# Patient Record
Sex: Female | Born: 1950 | Race: Black or African American | Hispanic: No | Marital: Married | Smoking: Never smoker
Health system: Southern US, Community
[De-identification: ages and names within clinical notes are randomized; demographics above are authoritative.]

## PROBLEM LIST (undated history)

## (undated) DIAGNOSIS — Z170 Estrogen receptor positive status [ER+]: Principal | ICD-10-CM

## (undated) DIAGNOSIS — Z8042 Family history of malignant neoplasm of prostate: Secondary | ICD-10-CM

## (undated) DIAGNOSIS — C50412 Malignant neoplasm of upper-outer quadrant of left female breast: Principal | ICD-10-CM

## (undated) DIAGNOSIS — Z923 Personal history of irradiation: Secondary | ICD-10-CM

## (undated) DIAGNOSIS — Z800 Family history of malignant neoplasm of digestive organs: Secondary | ICD-10-CM

## (undated) DIAGNOSIS — Z803 Family history of malignant neoplasm of breast: Secondary | ICD-10-CM

## (undated) DIAGNOSIS — Z8051 Family history of malignant neoplasm of kidney: Secondary | ICD-10-CM

## (undated) HISTORY — DX: Family history of malignant neoplasm of digestive organs: Z80.0

## (undated) HISTORY — DX: Family history of malignant neoplasm of prostate: Z80.42

## (undated) HISTORY — DX: Malignant neoplasm of upper-outer quadrant of left female breast: C50.412

## (undated) HISTORY — DX: Estrogen receptor positive status (ER+): Z17.0

## (undated) HISTORY — DX: Family history of malignant neoplasm of breast: Z80.3

## (undated) HISTORY — PX: BREAST EXCISIONAL BIOPSY: SUR124

## (undated) HISTORY — DX: Family history of malignant neoplasm of kidney: Z80.51

---

## 2000-03-24 ENCOUNTER — Ambulatory Visit (HOSPITAL_COMMUNITY): Admission: RE | Admit: 2000-03-24 | Discharge: 2000-03-24 | Payer: Self-pay | Admitting: Family Medicine

## 2000-03-24 ENCOUNTER — Encounter: Payer: Self-pay | Admitting: Family Medicine

## 2001-07-19 ENCOUNTER — Other Ambulatory Visit: Admission: RE | Admit: 2001-07-19 | Discharge: 2001-07-19 | Payer: Self-pay | Admitting: Family Medicine

## 2004-05-13 ENCOUNTER — Ambulatory Visit (HOSPITAL_COMMUNITY): Admission: RE | Admit: 2004-05-13 | Discharge: 2004-05-13 | Payer: Self-pay | Admitting: *Deleted

## 2005-07-16 ENCOUNTER — Other Ambulatory Visit: Admission: RE | Admit: 2005-07-16 | Discharge: 2005-07-16 | Payer: Self-pay | Admitting: Family Medicine

## 2006-09-22 ENCOUNTER — Other Ambulatory Visit: Admission: RE | Admit: 2006-09-22 | Discharge: 2006-09-22 | Payer: Self-pay | Admitting: Family Medicine

## 2009-11-30 ENCOUNTER — Emergency Department (HOSPITAL_COMMUNITY): Admission: EM | Admit: 2009-11-30 | Discharge: 2009-11-30 | Payer: Self-pay | Admitting: Emergency Medicine

## 2010-02-19 ENCOUNTER — Encounter: Admission: RE | Admit: 2010-02-19 | Discharge: 2010-02-19 | Payer: Self-pay | Admitting: Internal Medicine

## 2013-01-16 ENCOUNTER — Encounter (HOSPITAL_COMMUNITY): Payer: Self-pay | Admitting: Emergency Medicine

## 2013-01-16 ENCOUNTER — Emergency Department (HOSPITAL_COMMUNITY)
Admission: EM | Admit: 2013-01-16 | Discharge: 2013-01-16 | Disposition: A | Discharge status: Home / Self Care | Payer: Self-pay | Attending: Emergency Medicine | Admitting: Emergency Medicine

## 2013-01-16 DIAGNOSIS — S90562A Insect bite (nonvenomous), left ankle, initial encounter: Secondary | ICD-10-CM

## 2013-01-16 DIAGNOSIS — Z79899 Other long term (current) drug therapy: Secondary | ICD-10-CM | POA: Insufficient documentation

## 2013-01-16 DIAGNOSIS — Y939 Activity, unspecified: Secondary | ICD-10-CM | POA: Insufficient documentation

## 2013-01-16 DIAGNOSIS — IMO0001 Reserved for inherently not codable concepts without codable children: Secondary | ICD-10-CM | POA: Insufficient documentation

## 2013-01-16 DIAGNOSIS — Y9289 Other specified places as the place of occurrence of the external cause: Secondary | ICD-10-CM | POA: Insufficient documentation

## 2013-01-16 DIAGNOSIS — S90569A Insect bite (nonvenomous), unspecified ankle, initial encounter: Secondary | ICD-10-CM | POA: Insufficient documentation

## 2013-01-16 MED ORDER — PREDNISONE (PAK) 10 MG PO TABS: ORAL_TABLET | ORAL | Status: DC

## 2013-01-16 NOTE — ED Provider Notes (Signed)
This chart was scribed for non-physician practitioner Trisha Mangle, PA-C working with Derwood Kaplan, MD, by Candelaria Stagers, ED Scribe. This patient was seen in room TR05C/TR05C and the patient's care was started at 5:16 PM  CSN: [id number]     Arrival date & time 01/16/13  1403 History     First MD Initiated Contact with Patient 01/16/13 1701     Chief Complaint  Patient presents with  . Insect Bite    The history is provided by the patient. No language interpreter was used.   HPI Comments: Kerry Newman is a 62 y.o. female who presents to the Emergency Department complaining of left ankle redness and swelling that started yesterday after an insect stung her while she was mowing the yard.  Pt is unsure what stung her but states that it had wings.  She denies any allergies or h/o easily bruising or bleeding.  Pt ambulates.  She reports she has had a reaction to bee stings in the past, but has never experienced this much swelling.  Pt has taken benadryl with little relief.    History reviewed. No pertinent past medical history. History reviewed. No pertinent past surgical history. No family history on file. History  Substance Use Topics  . Smoking status: Not on file  . Smokeless tobacco: Not on file  . Alcohol Use: No   OB History   Grav Para Term Preterm Abortions TAB SAB Ect Mult Living                 Review of Systems  Skin: Positive for color change (redness and swelling to left ankle) and wound (insect sting to left ankle).  All other systems reviewed and are negative.    Allergies  Review of patient's allergies indicates no known allergies.  Home Medications   Current Outpatient Rx  Name  Route  Sig  Dispense  Refill  . diphenhydrAMINE (BENADRYL) 25 mg capsule   Oral   Take 25 mg by mouth every 6 (six) hours as needed for itching or allergies.         Marland Kitchen KRILL OIL PO   Oral   Take 1 capsule by mouth daily.         . Multiple Vitamin (MULTIVITAMIN WITH  MINERALS) TABS   Oral   Take 1 tablet by mouth daily.         Marland Kitchen VITAMIN E PO   Oral   Take 1 capsule by mouth daily.          BP 127/78  Pulse 78  Temp(Src) 98.2 F (36.8 C) (Oral)  Resp 18  Ht 5\' 8"  (1.727 m)  Wt 144 lb (65.318 kg)  BMI 21.9 kg/m2  SpO2 98% Physical Exam  Nursing note and vitals reviewed. Constitutional: She is oriented to person, place, and time. She appears well-developed and well-nourished. No distress.  HENT:  Head: Normocephalic and atraumatic.  Eyes: EOM are normal.  Neck: Neck supple. No tracheal deviation present.  Cardiovascular: Normal rate.   Pulmonary/Chest: Effort normal. No respiratory distress.  Musculoskeletal: Normal range of motion.  Redness, swelling, and hematoma to left lateral ankle.   Neurological: She is alert and oriented to person, place, and time.  Skin: Skin is warm and dry.  Psychiatric: She has a normal mood and affect. Her behavior is normal.    ED Course   Procedures   DIAGNOSTIC STUDIES: Oxygen Saturation is 98% on room air, normal by my interpretation.    COORDINATION  OF CARE:   5:27 PM Discussed course of care with pt which includes prescribed steroids.  Pt understands and agrees.     Labs Reviewed - No data to display No results found. 1. Insect bite of left ankle, initial encounter     MDM  No results found for this or any previous visit. No results found.    I personally performed the services in this documentation, which was scribed in my presence.  The recorded information has been reviewed and considered.   Barnet Pall.  Lonia Skinner [place], PA-C 01/16/13 1739

## 2013-01-16 NOTE — ED Notes (Signed)
Pt. Stated, i was mowing and something stung me.  Left lower leg and ankle swollen and red.

## 2013-01-17 NOTE — ED Provider Notes (Signed)
Medical screening examination/treatment/procedure(s) were performed by non-physician practitioner and as supervising physician I was immediately available for consultation/collaboration.  Graeme Menees, MD 01/17/13 0031 

## 2013-07-12 ENCOUNTER — Other Ambulatory Visit: Payer: Self-pay | Admitting: Internal Medicine

## 2013-07-12 DIAGNOSIS — K625 Hemorrhage of anus and rectum: Secondary | ICD-10-CM

## 2013-07-14 ENCOUNTER — Other Ambulatory Visit: Payer: Self-pay

## 2013-07-14 DIAGNOSIS — Z1231 Encounter for screening mammogram for malignant neoplasm of breast: Secondary | ICD-10-CM

## 2013-07-21 ENCOUNTER — Ambulatory Visit
Admission: RE | Admit: 2013-07-21 | Discharge: 2013-07-21 | Disposition: A | Discharge status: Home / Self Care | Payer: 59 | Source: Ambulatory Visit | Attending: Internal Medicine | Admitting: Internal Medicine

## 2013-07-21 DIAGNOSIS — K625 Hemorrhage of anus and rectum: Secondary | ICD-10-CM

## 2013-07-21 MED ORDER — IOHEXOL 300 MG/ML  SOLN
100.0000 mL | Freq: Once | INTRAMUSCULAR | Status: AC | PRN
  Administered 2013-07-21: 100 mL via INTRAVENOUS

## 2013-07-22 ENCOUNTER — Other Ambulatory Visit: Payer: Self-pay

## 2013-08-03 ENCOUNTER — Ambulatory Visit: Payer: Self-pay

## 2013-08-30 ENCOUNTER — Ambulatory Visit: Payer: 59

## 2013-09-08 ENCOUNTER — Ambulatory Visit
Admission: RE | Admit: 2013-09-08 | Discharge: 2013-09-08 | Disposition: A | Discharge status: Home / Self Care | Payer: 59 | Source: Ambulatory Visit

## 2013-09-08 DIAGNOSIS — Z1231 Encounter for screening mammogram for malignant neoplasm of breast: Secondary | ICD-10-CM

## 2015-01-22 ENCOUNTER — Other Ambulatory Visit: Payer: Self-pay

## 2015-01-22 DIAGNOSIS — Z1231 Encounter for screening mammogram for malignant neoplasm of breast: Secondary | ICD-10-CM

## 2015-01-31 ENCOUNTER — Ambulatory Visit: Payer: Self-pay

## 2015-03-07 ENCOUNTER — Ambulatory Visit
Admission: RE | Admit: 2015-03-07 | Discharge: 2015-03-07 | Disposition: A | Discharge status: Home / Self Care | Payer: 59 | Source: Ambulatory Visit

## 2015-03-07 DIAGNOSIS — Z1231 Encounter for screening mammogram for malignant neoplasm of breast: Secondary | ICD-10-CM

## 2015-04-28 ENCOUNTER — Other Ambulatory Visit: Payer: Self-pay | Admitting: Internal Medicine

## 2015-04-28 DIAGNOSIS — E2839 Other primary ovarian failure: Secondary | ICD-10-CM

## 2016-09-10 ENCOUNTER — Emergency Department (HOSPITAL_BASED_OUTPATIENT_CLINIC_OR_DEPARTMENT_OTHER)
Admission: EM | Admit: 2016-09-10 | Discharge: 2016-09-10 | Disposition: A | Discharge status: Home / Self Care | Payer: Medicare HMO | Attending: Emergency Medicine | Admitting: Emergency Medicine

## 2016-09-10 ENCOUNTER — Encounter (HOSPITAL_BASED_OUTPATIENT_CLINIC_OR_DEPARTMENT_OTHER): Payer: Self-pay | Admitting: Emergency Medicine

## 2016-09-10 ENCOUNTER — Emergency Department (HOSPITAL_BASED_OUTPATIENT_CLINIC_OR_DEPARTMENT_OTHER): Payer: Medicare HMO

## 2016-09-10 DIAGNOSIS — Z7982 Long term (current) use of aspirin: Secondary | ICD-10-CM | POA: Diagnosis not present

## 2016-09-10 DIAGNOSIS — K625 Hemorrhage of anus and rectum: Secondary | ICD-10-CM

## 2016-09-10 DIAGNOSIS — R1084 Generalized abdominal pain: Secondary | ICD-10-CM | POA: Diagnosis present

## 2016-09-10 DIAGNOSIS — R55 Syncope and collapse: Secondary | ICD-10-CM

## 2016-09-10 DIAGNOSIS — F17290 Nicotine dependence, other tobacco product, uncomplicated: Secondary | ICD-10-CM | POA: Diagnosis not present

## 2016-09-10 LAB — COMPREHENSIVE METABOLIC PANEL
ALT: 22 U/L (ref 14–54)
AST: 24 U/L (ref 15–41)
Albumin: 4.1 g/dL (ref 3.5–5.0)
Alkaline Phosphatase: 83 U/L (ref 38–126)
Anion gap: 8 (ref 5–15)
BILIRUBIN TOTAL: 1.3 mg/dL — AB (ref 0.3–1.2)
BUN: 13 mg/dL (ref 6–20)
CO2: 27 mmol/L (ref 22–32)
Calcium: 9 mg/dL (ref 8.9–10.3)
Chloride: 104 mmol/L (ref 101–111)
Creatinine, Ser: 0.76 mg/dL (ref 0.44–1.00)
GFR calc non Af Amer: >60 mL/min (ref >60)
Glucose, Bld: 117 mg/dL — ABNORMAL HIGH (ref 65–99)
Potassium: 3.7 mmol/L (ref 3.5–5.1)
Sodium: 139 mmol/L (ref 135–145)
TOTAL PROTEIN: 8 g/dL (ref 6.5–8.1)

## 2016-09-10 LAB — URINALYSIS, ROUTINE W REFLEX MICROSCOPIC
Bilirubin Urine: NEGATIVE
GLUCOSE, UA: NEGATIVE mg/dL
HGB URINE DIPSTICK: NEGATIVE
Ketones, ur: 15 mg/dL — AB
Leukocytes, UA: NEGATIVE
Nitrite: NEGATIVE
Protein, ur: NEGATIVE mg/dL
Specific Gravity, Urine: 1.004 — ABNORMAL LOW (ref 1.005–1.030)
pH: 7.5 (ref 5.0–8.0)

## 2016-09-10 LAB — CBC WITH DIFFERENTIAL/PLATELET
BASOS ABS: 0 10*3/uL (ref 0.0–0.1)
Basophils Relative: 0 %
EOS PCT: 0 %
Eosinophils Absolute: 0 10*3/uL (ref 0.0–0.7)
HEMATOCRIT: 37.9 % (ref 36.0–46.0)
Hemoglobin: 12.8 g/dL (ref 12.0–15.0)
LYMPHS PCT: 9 %
Lymphs Abs: 0.6 10*3/uL — ABNORMAL LOW (ref 0.7–4.0)
MCH: 31.2 pg (ref 26.0–34.0)
MCHC: 33.8 g/dL (ref 30.0–36.0)
MCV: 92.4 fL (ref 78.0–100.0)
MONOS PCT: 5 %
Monocytes Absolute: 0.3 10*3/uL (ref 0.1–1.0)
Neutro Abs: 6 10*3/uL (ref 1.7–7.7)
Neutrophils Relative %: 86 %
Platelets: 241 10*3/uL (ref 150–400)
RBC: 4.1 MIL/uL (ref 3.87–5.11)
RDW: 12.5 % (ref 11.5–15.5)
WBC: 7 10*3/uL (ref 4.0–10.5)

## 2016-09-10 LAB — OCCULT BLOOD X 1 CARD TO LAB, STOOL: Fecal Occult Bld: POSITIVE — AB

## 2016-09-10 LAB — TROPONIN I: Troponin I: <0.03 ng/mL (ref <0.03)

## 2016-09-10 MED ORDER — IOPAMIDOL (ISOVUE-300) INJECTION 61%
100.0000 mL | Freq: Once | INTRAVENOUS | Status: AC | PRN
  Administered 2016-09-10: 100 mL via INTRAVENOUS

## 2016-09-10 MED ORDER — SODIUM CHLORIDE 0.9 % IV BOLUS (SEPSIS)
1000.0000 mL | Freq: Once | INTRAVENOUS | Status: AC
  Administered 2016-09-10: 1000 mL via INTRAVENOUS

## 2016-09-10 NOTE — ED Provider Notes (Signed)
[facility] Provider Note   CSN: [id number] Arrival date & time: 09/10/16  0957     History   Chief Complaint Chief Complaint  Patient presents with  . Loss of Consciousness  . Rectal Bleeding    HPI Kerry Newman is a 66 y.o. female.  HPI 65 year old African American female with no significant past medical history presents to the ED today with complaints of syncopal episode, rectal bleeding, abdominal pain. Patient states that last night around 3 AM in the morning she woke up with severe abdominal pain. Felt like she had to use the restroom. Patient began walking to the restroom and had a syncopal episode. She remembers is waking up with her husband next to her. She is not under hitting her head. Husband is unsure if patient hit her head. She still needed she is restroom sore husband sat her on the toilet. While the toilet she was trying to have a bowel movement and had another syncopal episode falling over hitting her head on the shower. He was only out for a few seconds when she woke up. Afterwards she used the restroom and noticed bright red blood with clots in her stool. States she has had 10 bowel movements like this today. She denies any rectal bleeding in the ED. States that the abdominal pain was generalized. Denies any pain at this time. Sequel episode was not associated with chest pain or shortness of breath. Denies any prodromal symptoms. Denies any seizure-like activity. States her last colonoscopy was 2 years ago that was normal. She is followed [facility]. Denies any use of blood thinners. Patient denies any headache, vision changes, photophobia lightheadedness, dizziness, fevers, chest pain, shortness of breath, abdominal pain, nausea, vomiting urinary symptoms, vaginal symptoms, paresthesias, neck pain, back pain. History reviewed. No pertinent past medical history.  There are no active problems to display for this patient.   Past Surgical History:    Procedure Laterality Date  . CESAREAN SECTION      OB History    No data available       Home Medications    Prior to Admission medications   Medication Sig Start Date End Date Taking? Authorizing Provider  aspirin 81 MG chewable tablet Chew 81 mg by mouth daily.   Yes Historical Provider, MD  Multiple Vitamin (MULTIVITAMIN WITH MINERALS) TABS Take 1 tablet by mouth daily.   Yes Historical Provider, MD    Family History No family history on file.  Social History Social History  Substance Use Topics  . Smoking status: Current Every Day Smoker  . Smokeless tobacco: Current User  . Alcohol use No     Allergies   Patient has no known allergies.   Review of Systems Review of Systems  Constitutional: Negative for chills and fever.  HENT: Negative for congestion.   Eyes: Negative for visual disturbance.  Respiratory: Negative for cough and shortness of breath.   Cardiovascular: Negative for chest pain, palpitations and leg swelling.  Gastrointestinal: Positive for abdominal pain (generalized none at this time) and blood in stool. Negative for diarrhea, nausea, rectal pain and vomiting.  Genitourinary: Negative for dysuria, flank pain, frequency, hematuria and urgency.  Skin: Negative.   Neurological: Positive for syncope and light-headedness. Negative for dizziness, weakness, numbness and headaches.     Physical Exam Updated Vital Signs BP 127/67 (BP Location: Left Arm)   Pulse 78   Temp 98.2 F (36.8 C) (Oral)   Resp 18   Ht 5'  8" (1.727 m)   Wt 69.4 kg   SpO2 100%   BMI 23.26 kg/m   Physical Exam  Constitutional: She is oriented to person, place, and time. She appears well-developed and well-nourished. No distress.  Nontoxic-appearing. Patient resting comfortably on the bed.  HENT:  Head: Normocephalic and atraumatic. Head is without raccoon's eyes and without Battle's sign.  Right Ear: Tympanic membrane, external ear and ear canal normal.  Left Ear:  Tympanic membrane, external ear and ear canal normal.  Nose: Nose normal.  Mouth/Throat: Uvula is midline, oropharynx is clear and moist and mucous membranes are normal.  No hemotympanum bilaterally. No septal hematoma. No contusions or abrasions to the head.  Eyes: Conjunctivae and EOM are normal. Pupils are equal, round, and reactive to light. Right eye exhibits no discharge. Left eye exhibits no discharge. No scleral icterus.  Neck: Normal range of motion. Neck supple. No thyromegaly present.  No midline tenderness. No deformity or step-offs noted.  Cardiovascular: Normal rate, regular rhythm, normal heart sounds and intact distal pulses.  Exam reveals no gallop and no friction rub.   No murmur heard. Pulmonary/Chest: Effort normal and breath sounds normal. No respiratory distress. She has no wheezes. She exhibits no tenderness.  CTAB  Abdominal: Soft. Bowel sounds are normal. She exhibits no distension. There is no tenderness. There is no rigidity, no rebound, no guarding and no CVA tenderness.  Genitourinary:  Genitourinary Comments: Chaperone present for exam. Patient tolerated without difficulties. Normal rectal tone noted. Soft brown stool with pink tinge noted in rectal vault. Hemoccult positive but no gross melena or hematochezia. Did not appreciate any internal or external hemorrhoids.  Musculoskeletal: Normal range of motion.  No lower edema or calf tenderness.   Lymphadenopathy:    She has no cervical adenopathy.  Neurological: She is alert and oriented to person, place, and time.  The patient is alert, attentive, and oriented x 3. Speech is clear. Cranial nerve II-VII grossly intact. Negative pronator drift. Sensation intact. Strength 5/5 in all extremities. Reflexes 2+ and symmetric at biceps, triceps, knees, and ankles. Rapid alternating movement and fine finger movements intact. Romberg is absent. Posture and gait normal.   Skin: Skin is warm and dry. Capillary refill takes  less than 2 seconds.  Nursing note and vitals reviewed.    ED Treatments / Results  Labs (all labs ordered are listed, but only abnormal results are displayed) Labs Reviewed  CBC WITH DIFFERENTIAL/PLATELET - Abnormal; Notable for the following:       Result Value   Lymphs Abs 0.6 (*)    All other components within normal limits  COMPREHENSIVE METABOLIC PANEL - Abnormal; Notable for the following:    Glucose, Bld 117 (*)    Total Bilirubin 1.3 (*)    All other components within normal limits  URINALYSIS, ROUTINE W REFLEX MICROSCOPIC - Abnormal; Notable for the following:    Specific Gravity, Urine 1.004 (*)    Ketones, ur 15 (*)    All other components within normal limits  OCCULT BLOOD X 1 CARD TO LAB, STOOL - Abnormal; Notable for the following:    Fecal Occult Bld POSITIVE (*)    All other components within normal limits  TROPONIN I    EKG  EKG Interpretation  Date/Time:  Wednesday September 10 2016 10:18:05 EDT Ventricular Rate:  87 PR Interval:    QRS Duration: 80 QT Interval:  380 QTC Calculation: 458 R Axis:   82 Text Interpretation:  Sinus rhythm Normal ECG  Confirmed by Stark Jock  MD, DOUGLAS ([id number]) on 09/10/2016 10:30:14 AM       Radiology Ct Head Wo Contrast  Result Date: 09/10/2016 CLINICAL DATA:  Syncopal episode.  Fell.  Hit head. EXAM: CT HEAD WITHOUT CONTRAST CT CERVICAL SPINE WITHOUT CONTRAST TECHNIQUE: Multidetector CT imaging of the head and cervical spine was performed following the standard protocol without intravenous contrast. Multiplanar CT image reconstructions of the cervical spine were also generated. COMPARISON:  None. FINDINGS: CT HEAD FINDINGS Brain: The ventricles are normal in size and configuration. No extra-axial fluid collections are identified. The gray-white differentiation is normal. No CT findings for acute intracranial process such as hemorrhage or infarction. No mass lesions. The brainstem and cerebellum are grossly normal. Vascular: No  hyperdense vessels, obvious aneurysm or significant vascular calcifications. Skull: No skull fracture. Sinuses/Orbits: The paranasal sinuses and mastoid air cells are clear. The globes are intact. Other: No scalp lesion or hematoma. CT CERVICAL SPINE FINDINGS Alignment: Mild reversal of the normal cervical lordosis which could be due to positioning, muscle spasm or pain. The alignment is normal. The facets are normally aligned. Skull base and vertebrae: No acute fractures identified. The skullbase C1 and C1-2 articulations are maintained. Moderate degenerative changes at C1-2. Soft tissues and spinal canal: No prevertebral fluid or swelling. No visible canal hematoma. The spinal canal is generous. Disc levels:  No disc protrusions, spinal or foraminal stenosis. Upper chest: Apical scarring changes but no worrisome pulmonary lesions. Other: No neck mass or lymphadenopathy. IMPRESSION: 1. No acute intracranial findings or skull fracture. 2. No acute cervical spine fracture. Electronically Signed   By: Marijo Sanes M.D.   On: 09/10/2016 13:47   Ct Cervical Spine Wo Contrast  Result Date: 09/10/2016 CLINICAL DATA:  Syncopal episode.  Fell.  Hit head. EXAM: CT HEAD WITHOUT CONTRAST CT CERVICAL SPINE WITHOUT CONTRAST TECHNIQUE: Multidetector CT imaging of the head and cervical spine was performed following the standard protocol without intravenous contrast. Multiplanar CT image reconstructions of the cervical spine were also generated. COMPARISON:  None. FINDINGS: CT HEAD FINDINGS Brain: The ventricles are normal in size and configuration. No extra-axial fluid collections are identified. The gray-white differentiation is normal. No CT findings for acute intracranial process such as hemorrhage or infarction. No mass lesions. The brainstem and cerebellum are grossly normal. Vascular: No hyperdense vessels, obvious aneurysm or significant vascular calcifications. Skull: No skull fracture. Sinuses/Orbits: The paranasal  sinuses and mastoid air cells are clear. The globes are intact. Other: No scalp lesion or hematoma. CT CERVICAL SPINE FINDINGS Alignment: Mild reversal of the normal cervical lordosis which could be due to positioning, muscle spasm or pain. The alignment is normal. The facets are normally aligned. Skull base and vertebrae: No acute fractures identified. The skullbase C1 and C1-2 articulations are maintained. Moderate degenerative changes at C1-2. Soft tissues and spinal canal: No prevertebral fluid or swelling. No visible canal hematoma. The spinal canal is generous. Disc levels:  No disc protrusions, spinal or foraminal stenosis. Upper chest: Apical scarring changes but no worrisome pulmonary lesions. Other: No neck mass or lymphadenopathy. IMPRESSION: 1. No acute intracranial findings or skull fracture. 2. No acute cervical spine fracture. Electronically Signed   By: Marijo Sanes M.D.   On: 09/10/2016 13:47   Ct Abdomen Pelvis W Contrast  Result Date: 09/10/2016 CLINICAL DATA:  Fall after getting up to use the bathroom last night. Diarrhea with blood in stool. EXAM: CT ABDOMEN AND PELVIS WITH CONTRAST TECHNIQUE: Multidetector CT imaging of the abdomen  and pelvis was performed using the standard protocol following bolus administration of intravenous contrast. CONTRAST:  124mL ISOVUE-300 IOPAMIDOL (ISOVUE-300) INJECTION 61% COMPARISON:  None. FINDINGS: Lower chest: The lung bases are clear without focal nodule, mass, or airspace disease the heart size is normal. There is no pleural or pericardial is present. Hepatobiliary: A benign-appearing cyst within segment VIII of the liver measures 10 mm in transverse diameter, not significantly changed. Two other smaller lesions have not significantly changed. The common bile duct and gallbladder are normal. Pancreas: Unremarkable. No pancreatic ductal dilatation or surrounding inflammatory changes. Spleen: Normal in size without focal abnormality. Adrenals/Urinary  Tract: The adrenal glands are normal bilaterally. The kidneys and ureters are within normal limits. The urinary bladder is unremarkable. Stomach/Bowel: The stomach and duodenum are within normal limits. The small bowel is unremarkable. The appendix is visualized and normal. The cecum extends into the anatomic pelvis. The ascending and transverse colon are within normal limits. The descending and sigmoid colon are unremarkable. Vascular/Lymphatic: Aortic atherosclerosis. No enlarged abdominal or pelvic lymph nodes. Reproductive: Uterus and adnexa are within normal limits for age. Other: No abdominal wall hernia or abnormality. No abdominopelvic ascites. Musculoskeletal: Bone windows are unremarkable. No focal lytic or blastic lesions are present. That bony pelvis is intact. The hips are located bilaterally. IMPRESSION: 1. No acute focal lesion to explain the patient's symptoms. 2. Stable hepatic cysts. Electronically Signed   By: San Morelle M.D.   On: 09/10/2016 13:51    Procedures Procedures (including critical care time)  Medications Ordered in ED Medications  sodium chloride 0.9 % bolus 1,000 mL (0 mLs Intravenous Stopped 09/10/16 1222)  iopamidol (ISOVUE-300) 61 % injection 100 mL (100 mLs Intravenous Contrast Given 09/10/16 1321)     Initial Impression / Assessment and Plan / ED Course  I have reviewed the triage vital signs and the nursing notes.  Pertinent labs & imaging results that were available during my care of the patient were reviewed by me and considered in my medical decision making (see chart for details).     Patient presents to the ED with complaints of 2 syncopal episodes last night while having a bowel movement along with several episodes of rectal bleeding and associated abdominal pain. Denies any symptoms at this time. Patient is unsure if she hit her head during the single episode however was witnessed by her husband. EKG with normal sinus rhythm as reviewed by  myself and Dr. Stark Jock. Troponin was negative. Patient denies any chest pain or shortness of breath. No focal neuro deficits. Clinical presentation consistent with ACS, PE, CVA. No leukocytosis. All electrolytes are normal. Creatinine is normal. UA without signs of infection mild ketones likely from dehydration. She does have occult positive blood. However no gross melena or hematochezia noted. Patient has not had a bowel movement with bleeding while in the ED. She's been here for several hours without any episodes of syncope. Vital signs have been stable. No tachycardia or hypotensive. He was given a liter of fluids. The patient's syncopal episode without any signs of acute findings. No intra-abdominal abnormalities noted on CT scan to explain patient's symptoms. Hemoglobin is stable at 12.8. She denies any associated symptoms of acute blood loss. Denies any pain, lightheadedness, dizziness. Feel that patient's syncopal episodes or possible due to vasovagal response. Unsure of etiology or rectal bleeding. No hemorrhoids noted on exam. Have spoken with Dr. Paulita Fujita with [facility]. Still the patient was seen in the outpatient setting. Will schedule an appointment this  week for patient for possible colonoscopy. Patient had been given strict return precautions including worsening rectal bleeding or symptoms of acute blood loss. Patient verbalized understanding the plan of care and all questions were answered prior to discharge. Pt is hemodynamically stable, in NAD, & able to ambulate in the ED. Pain has been managed & has no complaints prior to dc. Pt is comfortable with above plan and is stable for discharge at this time. All questions were answered prior to disposition. Strict return precautions for f/u to the ED were discussed. Pt was seen and examined by Dr. Stark Jock who is agreeable to the above plan.    Final Clinical Impressions(s) / ED Diagnoses   Final diagnoses:  Syncope and collapse  Rectal bleeding    Generalized abdominal pain    New Prescriptions Discharge Medication List as of 09/10/2016  2:34 PM       Doristine Devoid, PA-C 09/10/16 1454    Veryl Speak, MD 09/10/16 1537

## 2016-09-10 NOTE — ED Notes (Signed)
Pt returned from CT °

## 2016-09-10 NOTE — Discharge Instructions (Signed)
IMAGING and lab work has been normal. Have spoken with Dr. Paulita Fujita with GI. Need to call their office today and schedule an appointment for this week. Please return to the ED if you develop any additional rectal bleeding, dizziness, worsening pain, lightheadedness or for any other reason.

## 2016-09-10 NOTE — ED Triage Notes (Signed)
Pt woke up at 3 am with abdominal pain.  Pt went to bathroom and had syncopal episode x 2.  Unsure of head injury, but denies N/V, no blurred vision, no headache.  Some lightheadedness.  Afterwards she went to bathroom and notice bright red blood with clots in her stool, pt has had 10 bms like this today.

## 2018-01-26 ENCOUNTER — Other Ambulatory Visit: Payer: Self-pay | Admitting: Internal Medicine

## 2018-01-26 DIAGNOSIS — Z1231 Encounter for screening mammogram for malignant neoplasm of breast: Secondary | ICD-10-CM

## 2018-01-26 DIAGNOSIS — E2839 Other primary ovarian failure: Secondary | ICD-10-CM

## 2018-01-29 ENCOUNTER — Ambulatory Visit
Admission: RE | Admit: 2018-01-29 | Discharge: 2018-01-29 | Disposition: A | Discharge status: Home / Self Care | Payer: Medicare HMO | Source: Ambulatory Visit | Attending: Internal Medicine | Admitting: Internal Medicine

## 2018-01-29 DIAGNOSIS — Z1231 Encounter for screening mammogram for malignant neoplasm of breast: Secondary | ICD-10-CM

## 2018-02-01 ENCOUNTER — Ambulatory Visit
Admission: RE | Admit: 2018-02-01 | Discharge: 2018-02-01 | Disposition: A | Discharge status: Home / Self Care | Payer: Medicare HMO | Source: Ambulatory Visit | Attending: Internal Medicine | Admitting: Internal Medicine

## 2018-02-01 ENCOUNTER — Other Ambulatory Visit: Payer: Self-pay | Admitting: Internal Medicine

## 2018-02-01 DIAGNOSIS — R928 Other abnormal and inconclusive findings on diagnostic imaging of breast: Secondary | ICD-10-CM

## 2018-02-01 DIAGNOSIS — E2839 Other primary ovarian failure: Secondary | ICD-10-CM

## 2018-02-03 ENCOUNTER — Other Ambulatory Visit: Payer: Self-pay | Admitting: Internal Medicine

## 2018-02-03 ENCOUNTER — Other Ambulatory Visit: Payer: Self-pay

## 2018-02-03 DIAGNOSIS — R928 Other abnormal and inconclusive findings on diagnostic imaging of breast: Secondary | ICD-10-CM

## 2018-02-04 ENCOUNTER — Other Ambulatory Visit: Payer: Medicare HMO

## 2018-02-09 ENCOUNTER — Ambulatory Visit
Admission: RE | Admit: 2018-02-09 | Discharge: 2018-02-09 | Disposition: A | Discharge status: Home / Self Care | Payer: Medicare HMO | Source: Ambulatory Visit | Attending: Internal Medicine | Admitting: Internal Medicine

## 2018-02-09 ENCOUNTER — Other Ambulatory Visit: Payer: Self-pay | Admitting: Internal Medicine

## 2018-02-09 DIAGNOSIS — R928 Other abnormal and inconclusive findings on diagnostic imaging of breast: Secondary | ICD-10-CM

## 2018-02-09 DIAGNOSIS — N6320 Unspecified lump in the left breast, unspecified quadrant: Secondary | ICD-10-CM

## 2018-02-11 ENCOUNTER — Telehealth: Payer: Self-pay | Admitting: Hematology and Oncology

## 2018-02-11 NOTE — Telephone Encounter (Signed)
Spoke with patient to confirm morning [facility] appointment for 8/28, packet will be mailed to patient

## 2018-02-12 ENCOUNTER — Encounter: Payer: Self-pay | Admitting: *Deleted

## 2018-02-12 DIAGNOSIS — C50412 Malignant neoplasm of upper-outer quadrant of left female breast: Secondary | ICD-10-CM

## 2018-02-12 DIAGNOSIS — Z170 Estrogen receptor positive status [ER+]: Principal | ICD-10-CM | POA: Insufficient documentation

## 2018-02-12 HISTORY — DX: Malignant neoplasm of upper-outer quadrant of left female breast: C50.412

## 2018-02-12 HISTORY — DX: Estrogen receptor positive status (ER+): Z17.0

## 2018-02-17 ENCOUNTER — Encounter: Payer: Self-pay | Admitting: Physical Therapy

## 2018-02-17 ENCOUNTER — Encounter: Payer: Self-pay | Admitting: *Deleted

## 2018-02-17 ENCOUNTER — Ambulatory Visit: Payer: Self-pay | Admitting: Surgery

## 2018-02-17 ENCOUNTER — Inpatient Hospital Stay: Payer: Medicare HMO

## 2018-02-17 ENCOUNTER — Ambulatory Visit: Payer: Medicare HMO | Attending: Surgery | Admitting: Physical Therapy

## 2018-02-17 ENCOUNTER — Encounter: Payer: Self-pay | Admitting: Hematology and Oncology

## 2018-02-17 ENCOUNTER — Inpatient Hospital Stay: Payer: Medicare HMO | Attending: Hematology and Oncology | Admitting: Hematology and Oncology

## 2018-02-17 ENCOUNTER — Encounter: Payer: Self-pay | Admitting: Radiation Oncology

## 2018-02-17 ENCOUNTER — Other Ambulatory Visit: Payer: Self-pay

## 2018-02-17 ENCOUNTER — Ambulatory Visit
Admission: RE | Admit: 2018-02-17 | Discharge: 2018-02-17 | Disposition: A | Discharge status: Home / Self Care | Payer: Medicare HMO | Source: Ambulatory Visit | Attending: Radiation Oncology | Admitting: Radiation Oncology

## 2018-02-17 DIAGNOSIS — Z800 Family history of malignant neoplasm of digestive organs: Secondary | ICD-10-CM | POA: Insufficient documentation

## 2018-02-17 DIAGNOSIS — C50412 Malignant neoplasm of upper-outer quadrant of left female breast: Secondary | ICD-10-CM | POA: Insufficient documentation

## 2018-02-17 DIAGNOSIS — Z7982 Long term (current) use of aspirin: Secondary | ICD-10-CM | POA: Insufficient documentation

## 2018-02-17 DIAGNOSIS — Z803 Family history of malignant neoplasm of breast: Secondary | ICD-10-CM | POA: Insufficient documentation

## 2018-02-17 DIAGNOSIS — R293 Abnormal posture: Secondary | ICD-10-CM

## 2018-02-17 DIAGNOSIS — Z170 Estrogen receptor positive status [ER+]: Principal | ICD-10-CM

## 2018-02-17 DIAGNOSIS — C50912 Malignant neoplasm of unspecified site of left female breast: Secondary | ICD-10-CM

## 2018-02-17 DIAGNOSIS — F17200 Nicotine dependence, unspecified, uncomplicated: Secondary | ICD-10-CM | POA: Insufficient documentation

## 2018-02-17 DIAGNOSIS — Z8051 Family history of malignant neoplasm of kidney: Secondary | ICD-10-CM | POA: Insufficient documentation

## 2018-02-17 DIAGNOSIS — R221 Localized swelling, mass and lump, neck: Secondary | ICD-10-CM

## 2018-02-17 LAB — CBC WITH DIFFERENTIAL (CANCER CENTER ONLY)
Basophils Absolute: 0 10*3/uL (ref 0.0–0.1)
Basophils Relative: 1 %
Eosinophils Absolute: 0.1 10*3/uL (ref 0.0–0.5)
Eosinophils Relative: 3 %
HEMATOCRIT: 39.3 % (ref 34.8–46.6)
Hemoglobin: 13 g/dL (ref 11.6–15.9)
LYMPHS ABS: 0.9 10*3/uL (ref 0.9–3.3)
LYMPHS PCT: 31 %
MCH: 31.3 pg (ref 25.1–34.0)
MCHC: 33.2 g/dL (ref 31.5–36.0)
MCV: 94.4 fL (ref 79.5–101.0)
MONO ABS: 0.4 10*3/uL (ref 0.1–0.9)
MONOS PCT: 15 %
Neutro Abs: 1.5 10*3/uL (ref 1.5–6.5)
Neutrophils Relative %: 50 %
PLATELETS: 217 10*3/uL (ref 145–400)
RBC: 4.16 MIL/uL (ref 3.70–5.45)
RDW: 13.3 % (ref 11.2–14.5)
WBC Count: 3 10*3/uL — ABNORMAL LOW (ref 3.9–10.3)

## 2018-02-17 LAB — CMP (CANCER CENTER ONLY)
ALBUMIN: 4.2 g/dL (ref 3.5–5.0)
ALT: 23 U/L (ref 0–44)
ANION GAP: 7 (ref 5–15)
AST: 23 U/L (ref 15–41)
Alkaline Phosphatase: 118 U/L (ref 38–126)
BILIRUBIN TOTAL: 0.9 mg/dL (ref 0.3–1.2)
BUN: 9 mg/dL (ref 8–23)
CHLORIDE: 105 mmol/L (ref 98–111)
CO2: 30 mmol/L (ref 22–32)
Calcium: 9.5 mg/dL (ref 8.9–10.3)
Creatinine: 0.94 mg/dL (ref 0.44–1.00)
GFR, Est AFR Am: >60 mL/min (ref >60)
GFR, Estimated: >60 mL/min (ref >60)
GLUCOSE: 103 mg/dL — AB (ref 70–99)
POTASSIUM: 3.9 mmol/L (ref 3.5–5.1)
SODIUM: 142 mmol/L (ref 135–145)
Total Protein: 8.4 g/dL — ABNORMAL HIGH (ref 6.5–8.1)

## 2018-02-17 NOTE — Progress Notes (Addendum)
Radiation Oncology         [phone] ________________________________  Initial outpatient Consultation  Name: Kerry Newman MRN: [id number]  Date: 02/17/2018  DOB: 05/04/1951  WJ:[id number], No Pcp Per  Alphonsa Overall, MD   REFERRING PHYSICIAN: Alphonsa Overall, MD  DIAGNOSIS:    ICD-10-CM   1. Malignant neoplasm of upper-outer quadrant of left breast in female, estrogen receptor positive ([place]) C50.412    Z17.0   Cancer Staging Malignant neoplasm of upper-outer quadrant of left breast in female, estrogen receptor positive ([place]) Staging form: Breast, AJCC 8th Edition - Clinical stage from 02/17/2018: Stage IA (cT1b, cN0, cM0, G2, ER+, PR+, HER2-) - Unsigned  CHIEF COMPLAINT: Here to discuss management of left breast cancer  HISTORY OF PRESENT ILLNESS::Kerry Newman is a 67 y.o. female who presented with left breast abnormality on the following imaging: mammography on the date of 01-29-18.  Symptoms, if any, at that time, were: no new symptoms.   Ultrasound of breast  revealed at 57m mass at 12:30 of left breast with negative axilla.   Biopsy on date of 02-09-18 showed invasive ductal carcinoma.  ER status: +; PR status +, Her2 status neg; Grade 2.  She reports a mass in the neck, weight change, insomnia, right leg spasms, glasses, vision changes, blurred vision, eye pain, sore throat with swallowing, feet swelling, chest pain at times, poor circulation, SOB, using multiple pillows, once a change in stool color, occ abd pain, 1 time rectal bleeding, dribbling urine, easy bruising, back and joint pain, walking difficulty, and fainting  PREVIOUS RADIATION THERAPY: No  PAST MEDICAL HISTORY:  has a past medical history of Malignant neoplasm of upper-outer quadrant of left breast in female, estrogen receptor positive ([place] (02/12/2018).    PAST SURGICAL HISTORY: Past Surgical History:  Procedure Laterality Date  . BREAST EXCISIONAL BIOPSY Left   . CESAREAN SECTION      FAMILY HISTORY:  family history includes Breast cancer in her sister; Kidney cancer in her brother; Liver cancer in her brother; Prostate cancer in her father; Stomach cancer in her sister.  SOCIAL HISTORY:  reports that she has been smoking. She uses smokeless tobacco. She reports that she does not drink alcohol or use drugs.  ALLERGIES: Patient has no known allergies.  MEDICATIONS:  Current Outpatient Medications  Medication Sig Dispense Refill  . aspirin 81 MG chewable tablet Chew 81 mg by mouth daily.    . Multiple Vitamin (MULTIVITAMIN WITH MINERALS) TABS Take 1 tablet by mouth daily.    .Marland KitchenTIZANIDINE HCL PO Take by mouth. As Needed     No current facility-administered medications for this encounter.     REVIEW OF SYSTEMS: A 10+ POINT REVIEW OF SYSTEMS WAS OBTAINED including neurology, dermatology, psychiatry, cardiac, respiratory, lymph, extremities, GI, GU, Musculoskeletal, constitutional, breasts, reproductive, HEENT.  All pertinent positives are noted in the HPI.  All others are negative.   PHYSICAL EXAM:  Vitals - 1 value per visit 89/56/2130 SYSTOLIC 1865 DIASTOLIC 78  Pulse 76  Temperature 98.8  Respirations 18  Weight (lb) 153.6  Height '5\' 8"'$   BMI 23.35  VISIT REPORT    General: Alert and oriented, in no acute distress HEENT: Head is normocephalic. Extraocular movements are intact. Oropharynx is clear. Neck: Neck is supple, + soft  1cm mass in right anterior neck/thyroid Heart: Regular in rate and rhythm with no murmurs, rubs, or gallops. Chest: Clear to auscultation bilaterally, with no rhonchi, wheezes, or rales. Abdomen: Soft, nontender, nondistended,  with no rigidity or guarding. Extremities: No cyanosis or edema. Lymphatics: see Neck Exam Skin: No concerning lesions. Musculoskeletal: symmetric strength and muscle tone throughout. Neurologic: Cranial nerves II through XII are grossly intact. No obvious focalities. Speech is fluent. Coordination is intact. Psychiatric: Judgment  and insight are intact. Affect is appropriate. Breasts: tenderness in bilateral breasts.  Nonspecific density of tissue in upper medial breast on right.  Lumpectomy scar/concavity in left breast centrally/9:00 from prior benign excision. No other palpable masses appreciated in the breasts or axillae other than post biopsy changes in 12:00 left breast with bruising .   ECOG = 0  0 - Asymptomatic (Fully active, able to carry on all predisease activities without restriction)  1 - Symptomatic but completely ambulatory (Restricted in physically strenuous activity but ambulatory and able to carry out work of a light or sedentary nature. For example, light housework, office work)  2 - Symptomatic, <50% in bed during the day (Ambulatory and capable of all self care but unable to carry out any work activities. Up and about more than 50% of waking hours)  3 - Symptomatic, >50% in bed, but not bedbound (Capable of only limited self-care, confined to bed or chair 50% or more of waking hours)  4 - Bedbound (Completely disabled. Cannot carry on any self-care. Totally confined to bed or chair)  5 - Death   Eustace Pen MM, Creech RH, Tormey DC, et al. [phone]). "Toxicity and response criteria of the [facility]". [place] Oncol. 5 (6): 649-55   LABORATORY DATA:  Lab Results  Component Value Date   WBC 3.0 (L) 02/17/2018   HGB 13.0 02/17/2018   HCT 39.3 02/17/2018   MCV 94.4 02/17/2018   PLT 217 02/17/2018   CMP     Component Value Date/Time   NA 142 02/17/2018 0836   K 3.9 02/17/2018 0836   CL 105 02/17/2018 0836   CO2 30 02/17/2018 0836   GLUCOSE 103 (H) 02/17/2018 0836   BUN 9 02/17/2018 0836   CREATININE 0.94 02/17/2018 0836   CALCIUM 9.5 02/17/2018 0836   PROT 8.4 (H) 02/17/2018 0836   ALBUMIN 4.2 02/17/2018 0836   AST 23 02/17/2018 0836   ALT 23 02/17/2018 0836   ALKPHOS 118 02/17/2018 0836   BILITOT 0.9 02/17/2018 0836   GFRNONAA >60 02/17/2018 0836   GFRAA >60  02/17/2018 0836         RADIOGRAPHY: Dg Bone Density (dxa)  Result Date: 02/01/2018 EXAM: DUAL X-RAY ABSORPTIOMETRY (DXA) FOR BONE MINERAL DENSITY IMPRESSION: Referring Physician:  [name] Your patient completed a BMD test using Lunar IDXA DXA system ( analysis version: 16 ) manufactured by [employer]. PATIENT: Name: Jakia, Kennebrew Patient ID: [id number] Birth Date: 09-Sep-1950 Height: 68.0 in. Sex: Female Measured: 02/01/2018 Weight: 154.9 lbs. Indications: Estrogen Deficient, Family History of Osteoporosis, Height Loss (781.91), Postmenopausal, Secondary Osteoporosis Fractures: None Treatments: Calcium (E943.0) ASSESSMENT: The BMD measured at Femur Total Left is 0.797 g/cm2 with a T-score of -1.7. This patient is considered osteopenic according to [facility]) criteria. The scan quality is good. L-3, L-4 were excluded due to degenerative changes. Site Region Measured Date Measured Age YA BMD Significant CHANGE T-score DualFemur Total Left 02/01/2018    66.8         -1.7    0.797 g/cm2 AP Spine  L1-L2      02/01/2018    66.8         0.0  1.170 g/cm2 DualFemur Total Mean 02/01/2018    66.8         -1.6    0.809 g/cm2 World Health Organization [place] [facility]) criteria for post-menopausal, Caucasian Women: Normal       T-score at or above -1 SD Osteopenia   T-score between -1 and -2.5 SD Osteoporosis T-score at or below -2.5 SD RECOMMENDATION: 1. All patients should optimize calcium and vitamin D intake. 2. Consider FDA approved medical therapies in postmenopausal women and men aged 74 years and older, based on the following: a. A hip or vertebral (clinical or morphometric) fracture b. T- score < or = -2.5 at the femoral neck or spine after appropriate evaluation to exclude secondary causes c. Low bone mass (T-score between -1.0 and -2.5 at the femoral neck or spine) and a 10 year probability of a hip fracture > or = 3% or a 10 year probability of a major osteoporosis-related fracture > or =  20% based on the [place]-adapted WHO algorithm d. Clinician judgment and/or patient preferences may indicate treatment for people with 10-year fracture probabilities above or below these levels FOLLOW-UP: People with diagnosed cases of osteoporosis or at high risk for fracture should have regular bone mineral density tests. For patients eligible for Medicare, routine testing is allowed once every 2 years. The testing frequency can be increased to one year for patients who have rapidly progressing disease, those who are receiving or discontinuing medical therapy to restore bone mass, or have additional risk factors. I have reviewed this report and agree with the above findings. [facility] FRAX* 10-year Probability of Fracture Based on femoral neck BMD: DualFemur (Right) Major Osteoporotic Fracture: 3.6% Hip Fracture:                0.3% Population:                  [place] ([place]) Risk Factors:                Secondary Osteoporosis *FRAX is a Materials engineer of the [employer] for Metabolic Bone Disease, a [place] (WHO) [facility]. ASSESSMENT: The probability of a major osteoporotic fracture is 3.6 % within the next ten years. The probability of hip fracture is  0.3 % within the next 10 years. Electronically Signed   By: Marijo Conception, M.D.   On: 02/01/2018 09:36   US Breast Ltd Uni Left Inc Axilla  Result Date: 02/09/2018 CLINICAL DATA:  Patient returns today to evaluate a possible LEFT breast mass identified on recent screening mammogram EXAM: DIGITAL DIAGNOSTIC LEFT MAMMOGRAM WITH CAD AND TOMO ULTRASOUND LEFT BREAST COMPARISON:  Previous exams including recent screening mammogram dated 01/29/2018. ACR Breast Density Category c: The breast tissue is heterogeneously dense, which may obscure small masses. FINDINGS: On today's additional diagnostic views with spot compression and 3D tomosynthesis, an irregular mass is confirmed within the upper-outer  quadrant, at posterior depth, measuring approximately 8 mm greatest dimension, with associated architectural distortion. Mammographic images were processed with CAD. Targeted ultrasound is performed, showing an irregular hypoechoic mass within the LEFT breast at the 12:30 o'clock axis, 5 cm from the nipple, measuring 9 mm, corresponding to the mammographic finding. LEFT axilla was evaluated with ultrasound showing no enlarged or morphologically abnormal lymph nodes. IMPRESSION: Irregular mass within the LEFT breast at the 12:30 o'clock axis, 5 cm from the nipple, measuring 9 mm, corresponding to the mammographic finding. This is a highly suspicious finding for which ultrasound-guided  biopsy is recommended. RECOMMENDATION: Ultrasound-guided biopsy of the LEFT breast mass at the 12:30 o'clock axis. Ultrasound-guided biopsy will be performed later today. I have discussed the findings and recommendations with the patient. Results were also provided in writing at the conclusion of the visit. If applicable, a reminder letter will be sent to the patient regarding the next appointment. BI-RADS CATEGORY  5: Highly suggestive of malignancy. Electronically Signed   By: Franki Cabot M.D.   On: 02/09/2018 09:41   Mm Diag Breast Tomo Uni Left  Result Date: 02/09/2018 CLINICAL DATA:  Patient returns today to evaluate a possible LEFT breast mass identified on recent screening mammogram EXAM: DIGITAL DIAGNOSTIC LEFT MAMMOGRAM WITH CAD AND TOMO ULTRASOUND LEFT BREAST COMPARISON:  Previous exams including recent screening mammogram dated 01/29/2018. ACR Breast Density Category c: The breast tissue is heterogeneously dense, which may obscure small masses. FINDINGS: On today's additional diagnostic views with spot compression and 3D tomosynthesis, an irregular mass is confirmed within the upper-outer quadrant, at posterior depth, measuring approximately 8 mm greatest dimension, with associated architectural distortion. Mammographic  images were processed with CAD. Targeted ultrasound is performed, showing an irregular hypoechoic mass within the LEFT breast at the 12:30 o'clock axis, 5 cm from the nipple, measuring 9 mm, corresponding to the mammographic finding. LEFT axilla was evaluated with ultrasound showing no enlarged or morphologically abnormal lymph nodes. IMPRESSION: Irregular mass within the LEFT breast at the 12:30 o'clock axis, 5 cm from the nipple, measuring 9 mm, corresponding to the mammographic finding. This is a highly suspicious finding for which ultrasound-guided biopsy is recommended. RECOMMENDATION: Ultrasound-guided biopsy of the LEFT breast mass at the 12:30 o'clock axis. Ultrasound-guided biopsy will be performed later today. I have discussed the findings and recommendations with the patient. Results were also provided in writing at the conclusion of the visit. If applicable, a reminder letter will be sent to the patient regarding the next appointment. BI-RADS CATEGORY  5: Highly suggestive of malignancy. Electronically Signed   By: Franki Cabot M.D.   On: 02/09/2018 09:41   Mm 3d Screen Breast Bilateral  Result Date: 01/29/2018 CLINICAL DATA:  Screening. EXAM: DIGITAL SCREENING BILATERAL MAMMOGRAM WITH TOMO AND CAD COMPARISON:  Previous exam(s). ACR Breast Density Category c: The breast tissue is heterogeneously dense, which may obscure small masses. FINDINGS: In the left breast, a possible mass warrants further evaluation. In the right breast, no findings suspicious for malignancy. Images were processed with CAD. IMPRESSION: Further evaluation is suggested for possible mass in the left breast. RECOMMENDATION: Diagnostic mammogram and possibly ultrasound of the left breast. (Code:[id number]) The patient will be contacted regarding the findings, and additional imaging will be scheduled. BI-RADS CATEGORY  0: Incomplete. Need additional imaging evaluation and/or prior mammograms for comparison. Electronically Signed   By:  Marin Olp M.D.   On: 01/29/2018 08:33   Mm Clip Placement Left  Result Date: 02/09/2018 CLINICAL DATA:  Post ultrasound-guided core needle biopsy of left breast 12:30 o'clock mass. EXAM: DIAGNOSTIC LEFT MAMMOGRAM POST ULTRASOUND BIOPSY COMPARISON:  Previous exam(s). FINDINGS: Mammographic images were obtained following ultrasound guided biopsy of left breast. Two-view mammography demonstrates presence of ribbon shaped marker in the left breast upper outer quadrant, in appropriate mammographic position. IMPRESSION: Successful placement of ribbon shaped marker post ultrasound-guided core needle biopsy of left breast 12:30 o'clock mass. Final Assessment: Post Procedure Mammograms for Marker Placement Electronically Signed   By: Fidela Salisbury M.D.   On: 02/09/2018 12:00   [place] Lt Breast Bx W  Loc Dev 1st Lesion Img Bx Spec US Guide  Addendum Date: 02/10/2018   ADDENDUM REPORT: 02/10/2018 14:08 ADDENDUM: Pathology revealed GRADE II - INVASIVE DUCTAL CARCINOMA of LEFT breast, 12:30 o'clock mass. This was found to be concordant by Dr. Fidela Salisbury. Pathology results were discussed with the patient by Terie Purser, RN, by telephone. The patient reported doing well after the biopsy with tenderness at the site. Post biopsy instructions and care were reviewed and questions were answered. The patient was encouraged to call [facility] for any additional concerns. The patient was referred to [facility] on February 17, 2018. Pathology results reported by Roselind Messier, RN on 02/10/2018. Electronically Signed   By: Fidela Salisbury M.D.   On: 02/10/2018 14:08   Result Date: 02/10/2018 CLINICAL DATA:  Left breast 12:30 o'clock suspicious mass. EXAM: ULTRASOUND GUIDED LEFT BREAST CORE NEEDLE BIOPSY COMPARISON:  Previous exam(s). FINDINGS: I met with the patient and we discussed the procedure of  ultrasound-guided biopsy, including benefits and alternatives. We discussed the high likelihood of a successful procedure. We discussed the risks of the procedure, including infection, bleeding, tissue injury, clip migration, and inadequate sampling. Informed written consent was given. The usual time-out protocol was performed immediately prior to the procedure. Lesion quadrant: Upper outer quadrant Using sterile technique and 1% Lidocaine as local anesthetic, under direct ultrasound visualization, a 14 gauge spring-loaded device was used to perform biopsy of left breast 12:30 o'clock mass using a lateral approach. At the conclusion of the procedure a ribbon shaped tissue marker clip was deployed into the biopsy cavity. Follow up 2 view mammogram was performed and dictated separately. IMPRESSION: Ultrasound guided biopsy of left breast.  No apparent complications. Electronically Signed: By: Fidela Salisbury M.D. On: 02/09/2018 11:50      IMPRESSION/PLAN: Left breast cancer, Stage I, ER+  She has been discussed at our multidisciplinary tumor board.  The consensus is that she would be a good candidate for breast conservation. I talked to her about the option of a mastectomy and informed her that her expected overall survival would be equivalent between mastectomy and breast conservation, based upon randomized controlled data. She is enthusiastic about breast conservation.  It was a pleasure meeting the patient today. We discussed the risks, benefits, and side effects of radiotherapy. I recommend radiotherapy to the left breast to reduce her risk of locoregional recurrence by 2/3.  We discussed that radiation would take approximately 4 weeks to complete and that I would give the patient a few weeks to heal following surgery before starting treatment planning. If chemotherapy were to be given, this would precede radiotherapy. We spoke about acute effects including skin irritation and fatigue as well as much  less common late effects including internal organ injury or irritation. We spoke about the latest technology that is used to minimize the risk of late effects for patients undergoing radiotherapy to the breast or chest wall. No guarantees of treatment were given. The patient is enthusiastic about proceeding with treatment. I look forward to participating in the patient's care.  I will await her referral back to me for postoperative follow-up and eventual CT simulation/treatment planning.  Right breast tissue is tender, dense, but negative on mammography. Patient told to discuss this with Dr Lucia Gaskins during breast exam in case he thinks more imaging is warranted.  Will refer to ENT for ? Thyroid nodule on exam today.  __________________________________________  Eppie Gibson, MD

## 2018-02-17 NOTE — Assessment & Plan Note (Signed)
02/09/2018:Screening detected left breast mass UOQ at 12:30 position 9 mm size, axilla negative, ultrasound biopsy revealed grade 2 IDC ER 100%, PR 100%, Ki-67 2%, HER-2 negative IHC 1+, T1b N0 stage Ia AJCC 8  Pathology and radiology counseling:Discussed with the patient, the details of pathology including the type of breast cancer,the clinical staging, the significance of ER, PR and HER-2/neu receptors and the implications for treatment. After reviewing the pathology in detail, we proceeded to discuss the different treatment options between surgery, radiation, chemotherapy, antiestrogen therapies.  Recommendations: 1. Breast conserving surgery followed by 2. Oncotype DX testing to determine if chemotherapy would be of any benefit followed by 3. Adjuvant radiation therapy followed by 4. Adjuvant antiestrogen therapy  Oncotype counseling: I discussed Oncotype DX test. I explained to the patient that this is a 21 gene panel to evaluate patient tumors DNA to calculate recurrence score. This would help determine whether patient has high risk or intermediate risk or low risk breast cancer. She understands that if her tumor was found to be high risk, she would benefit from systemic chemotherapy. If low risk, no need of chemotherapy. If she was found to be intermediate risk, we would need to evaluate the score as well as other risk factors and determine if an abbreviated chemotherapy may be of benefit.  Return to clinic after surgery to discuss final pathology report and then determine if Oncotype DX testing will need to be sent.

## 2018-02-17 NOTE — Patient Instructions (Signed)

## 2018-02-17 NOTE — Therapy (Signed)
[place], [place], [address] Phone: [phone]   Fax:  [phone]  Physical Therapy Evaluation  Patient Details  Name: Kerry Newman MRN: [id number] Date of Birth: 05-30-1951 Referring Provider: Dr. Alphonsa Overall   Encounter Date: 02/17/2018  PT End of Session - 02/17/18 1213    Visit Number  1    Number of Visits  2    Date for PT Re-Evaluation  04/14/18    PT Start Time  0600    PT Stop Time  1016   Also saw pt from 1102-1117 for a total of 29 minutes   PT Time Calculation (min)  14 min    Activity Tolerance  Patient tolerated treatment well    Behavior During Therapy  [facility] for tasks assessed/performed       Past Medical History:  Diagnosis Date  . Malignant neoplasm of upper-outer quadrant of left breast in female, estrogen receptor positive ([place]) 02/12/2018    Past Surgical History:  Procedure Laterality Date  . BREAST EXCISIONAL BIOPSY Left   . CESAREAN SECTION      There were no vitals filed for this visit.   Subjective Assessment - 02/17/18 1139    Subjective  Patient reports she is here today to be by her medical team for her newly diagnosed left breast cancer.    Patient is accompained by:  Family member    Pertinent History  Patient was diagnosed on 01/29/18 with left grade II invasive ductal carcinoma breast cancer. It measures 9 mm and is located in the upper outer quadrant. It is ER/PR positive and HER2 negative with a Ki67 of 2%.     Patient Stated Goals  Reduce lymphedema risk and learn post op shoulder ROM HEP    Currently in Pain?  Yes    Pain Score  4     Pain Location  Back    Pain Orientation  Right    Pain Descriptors / Indicators  Burning;Aching    Pain Type  Neuropathic pain    Pain Radiating Towards  Right lateral leg to foot    Pain Onset  1 to 4 weeks ago    Pain Frequency  Intermittent    Aggravating Factors   Standing    Pain Relieving Factors  Unknown    Multiple Pain Sites  No         [facility] PT Assessment - 02/17/18 0001      Assessment   Medical Diagnosis  Left breast cancer    Referring Provider  Dr. Alphonsa Overall    Onset Date/Surgical Date  01/29/18    Hand Dominance  Right    Prior Therapy  none      Precautions   Precautions  Other (comment)    Precaution Comments  active cancer      Restrictions   Weight Bearing Restrictions  No      Balance Screen   Has the patient fallen in the past 6 months  No    Has the patient had a decrease in activity level because of a fear of falling?   No    Is the patient reluctant to leave their home because of a fear of falling?   No      Home Environment   Living Environment  Private residence    Living Arrangements  Spouse/significant other    Available Help at Discharge  Family      Prior Function   Level  of [place]  Retired    Leisure  She does not exercise      Cognition   Overall Cognitive Status  Within Functional Limits for tasks assessed      Posture/Postural Control   Posture/Postural Control  Postural limitations    Postural Limitations  Rounded Shoulders;Forward head      ROM / Strength   AROM / PROM / Strength  AROM;Strength      AROM   AROM Assessment Site  Shoulder;Cervical    Right/Left Shoulder  Right;Left    Right Shoulder Extension  44 Degrees    Right Shoulder Flexion  149 Degrees    Right Shoulder ABduction  158 Degrees    Right Shoulder Internal Rotation  64 Degrees    Right Shoulder External Rotation  90 Degrees    Left Shoulder Extension  44 Degrees    Left Shoulder Flexion  131 Degrees    Left Shoulder ABduction  150 Degrees    Left Shoulder Internal Rotation  57 Degrees    Left Shoulder External Rotation  83 Degrees    Cervical Flexion  WNL    Cervical Extension  WNL    Cervical - Right Side Bend  WNL    Cervical - Left Side Bend  WNL    Cervical - Right Rotation  WNL    Cervical - Left Rotation  WNL       Strength   Overall Strength  Within functional limits for tasks performed        LYMPHEDEMA/ONCOLOGY QUESTIONNAIRE - 02/17/18 1212      Type   Cancer Type  Left breast cancer      Lymphedema Assessments   Lymphedema Assessments  Upper extremities      Right Upper Extremity Lymphedema   10 cm Proximal to Olecranon Process  30 cm    Olecranon Process  25.6 cm    10 cm Proximal to Ulnar Styloid Process  21.6 cm    Just Proximal to Ulnar Styloid Process  15.4 cm    Across Hand at [employer]  19.1 cm    At [place] of 2nd Digit  6.5 cm      Left Upper Extremity Lymphedema   10 cm Proximal to Olecranon Process  28.7 cm    Olecranon Process  25 cm    10 cm Proximal to Ulnar Styloid Process  20 cm    Just Proximal to Ulnar Styloid Process  15.3 cm    Across Hand at [employer]  18.8 cm    At [place] of 2nd Digit  6.5 cm             Objective measurements completed on examination: See above findings.     Patient was instructed today in a home exercise program today for post op shoulder range of motion. These included active assist shoulder flexion in sitting, scapular retraction, wall walking with shoulder abduction, and hands behind head external rotation.  She was encouraged to do these twice a day, holding 3 seconds and repeating 5 times when permitted by her physician.             PT Education - 02/17/18 1213    Education Details  Lymphedema risk reduction and post op shoulder ROM HEP    Person(s) Educated  Patient;Spouse    Methods  Explanation;Demonstration;Handout    Comprehension  Returned demonstration;Verbalized understanding          PT  Long Term Goals - 02/17/18 1217      PT LONG TERM GOAL #1   Title  Patient will demonstrate she has regainde left shoulder ROM and function post operatively compared to baseline measurements.    Time  [facility] Goals - 02/17/18 1217      Patient will be able to  verbalize understanding of pertinent lymphedema risk reduction practices relevant to her diagnosis specifically related to skin care.   Time  1    Period  Days    Status  Achieved      Patient will be able to return demonstrate and/or verbalize understanding of the post-op home exercise program related to regaining shoulder range of motion.   Time  1    Period  Days    Status  Achieved      Patient will be able to verbalize understanding of the importance of attending the postoperative After Breast Cancer Class for further lymphedema risk reduction education and therapeutic exercise.   Time  1    Period  Days    Status  Achieved            Plan - 02/17/18 1214    Clinical Impression Statement  Patient was diagnosed on 01/29/18 with left grade II invasive ductal carcinoma breast cancer. It measures 9 mm and is located in the upper outer quadrant. It is ER/PR positive and HER2 negative with a Ki67 of 2%.  Her multidisciplinary medical team met prior to her assessments to determine a recommended treatment plan. She is planning to have a left lumpectomy and sentinel node biopsy followed by Oncotype testing, radiation, and anti-estrogen therapy. She will benefit from a post op PT assessment to determine needs.    Clinical Presentation  Stable    Clinical Decision Making  Low    Rehab Potential  Excellent    Clinical Impairments Affecting Rehab Potential  None    PT Frequency  --   Eval and 1 f/u after surgery   PT Treatment/Interventions  ADLs/Self Care Home Management;Therapeutic exercise;Patient/family education    PT Next Visit Plan  Will reassess after surgery    PT Home Exercise Plan  Post op shoulder ROM HEP    Consulted and Agree with Plan of Care  Patient;Family member/caregiver    Family Member Consulted  Husband       Patient will benefit from skilled therapeutic intervention in order to improve the following deficits and impairments:  Postural dysfunction, Decreased range of  motion, Pain, Impaired UE functional use, Decreased knowledge of precautions  Visit Diagnosis: Malignant neoplasm of upper-outer quadrant of left breast in female, estrogen receptor positive ([place]) - Plan: PT plan of care cert/re-cert  Abnormal posture - Plan: PT plan of care cert/re-cert   Patient will follow up at outpatient cancer rehab 3-4 weeks following surgery.  If the patient requires physical therapy at that time, a specific plan will be dictated and sent to the referring physician for approval. The patient was educated today on appropriate basic range of motion exercises to begin post operatively and the importance of attending the After Breast Cancer class following surgery.  Patient was educated today on lymphedema risk reduction practices as it pertains to recommendations that will benefit the patient immediately following surgery.  She verbalized good understanding.     Problem List Patient Active Problem List   Diagnosis Date Noted  .  Malignant neoplasm of upper-outer quadrant of left breast in female, estrogen receptor positive ([place]) 02/12/2018    Annia Friendly, PT 02/17/18 12:20 PM   [place], [place], [address] Phone: [phone]   Fax:  [phone]  Name: Kerry Newman MRN: [id number] Date of Birth: September 26, 1950

## 2018-02-17 NOTE — Progress Notes (Signed)
Nutrition Assessment  Reason for Assessment:  Pt seen in Breast Clinic  ASSESSMENT:   67 year old female with new diagnosis of breast cancer.  Past medical history reviewed.    Patient reports normal appetite.    Medications:  reviewed  Labs: reviewed  Anthropometrics:   Height: 68 inches Weight: 153 lb BMI: 23   NUTRITION DIAGNOSIS: Food and nutrition related knowledge deficit related to new diagnosis of breast cancer as evidenced by no prior need for nutrition related information.  INTERVENTION:   Discussed and provided packet of information regarding nutritional tips for breast cancer patients.  Questions answered.  Teachback method used.  Contact information provided and patient knows to contact me with questions/concerns.    MONITORING, EVALUATION, and GOAL: Pt will consume a healthy plant based diet to maintain lean body mass throughout treatment.   Kerry Newman B. Zenia Resides, [place], [place] Registered Dietitian [phone] (pager)

## 2018-02-17 NOTE — Progress Notes (Signed)
Clinical Social Work [place] Psychosocial Distress Screening [place]  Patient completed distress screening protocol and scored a 0 on the Psychosocial Distress Thermometer which indicates no distress. Clinical Social Worker met with patient and patients family in [facility] to assess for distress and other psychosocial needs. Patient stated she was feeling overwhelmed but felt "better" after meeting with the treatment team and getting more information on her treatment plan. CSW and patient discussed common feeling and emotions when being diagnosed with cancer, and the importance of support during treatment. CSW informed patient of the support team and support services at [facility]. CSW provided contact information and encouraged patient to call with any questions or concerns.  [facility] DISTRESS SCREENING 02/17/2018  Screening Type Initial Screening  Distress experienced in past week (1-10) 0  Practical problem type Insurance  Emotional problem type Nervousness/Anxiety  Spiritual/Religous concerns type Relating to God  Information Concerns Type Lack of info about diagnosis  Physical Problem type Pain;Breathing;Tingling hands/feet;Swollen arms/legs  Physician notified of physical symptoms Yes     Johnnye Lana, MSW, LCSW, OSW-C Clinical Social Worker [place] [phone]

## 2018-02-17 NOTE — Progress Notes (Signed)
[facility] CONSULT NOTE  Patient Care Team: Patient, No Pcp Per as PCP - General (General Practice) Alphonsa Overall, MD as Consulting Physician (General Surgery) Nicholas Lose, MD as Consulting Physician (Hematology and Oncology) Eppie Gibson, MD as Attending Physician (Radiation Oncology)  CHIEF COMPLAINTS/PURPOSE OF CONSULTATION:  Newly diagnosed breast cancer  HISTORY OF PRESENTING ILLNESS:  Kerry Newman is here because of recent diagnosis of left breast cancer.  Patient had a screening mammogram that detected a left breast mass in the upper outer quadrant at 12:30 position which measured 9 mm in size axilla was negative.  Ultrasound-guided biopsy revealed invasive ductal carcinoma grade 2 that was ER 100%, PR 90%, Ki-67 2% and HER-2 negative.  She was presented this morning to the multidisciplinary tumor board and she is here today accompanied by her family to discuss treatment plan.  She has a half sister with breast cancer who is also a patient of mine.  I reviewed her records extensively and collaborated the history with the patient.  SUMMARY OF ONCOLOGIC HISTORY:   Malignant neoplasm of upper-outer quadrant of left breast in Newman, estrogen receptor positive ([place])   02/09/2018 Initial Diagnosis    Screening detected left breast mass UOQ at 12:30 position 9 mm size, axilla negative, ultrasound biopsy revealed grade 2 IDC ER 100%, PR 100%, Ki-67 2%, HER-2 negative IHC 1+, T1b N0 stage Ia AJCC 8    MEDICAL HISTORY:  Past Medical History:  Diagnosis Date  . Malignant neoplasm of upper-outer quadrant of left breast in Newman, estrogen receptor positive ([place]) 02/12/2018    SURGICAL HISTORY: Past Surgical History:  Procedure Laterality Date  . BREAST EXCISIONAL BIOPSY Left   . CESAREAN SECTION      SOCIAL HISTORY: Social History   Socioeconomic History  . Marital status: Married    Spouse name: Not on file  . Number of children: Not on file   . Years of education: Not on file  . Highest education level: Not on file  Occupational History  . Not on file  Social Needs  . Financial resource strain: Not on file  . Food insecurity:    Worry: Not on file    Inability: Not on file  . Transportation needs:    Medical: Not on file    Non-medical: Not on file  Tobacco Use  . Smoking status: Current Every Day Smoker  . Smokeless tobacco: Current User  Substance and Sexual Activity  . Alcohol use: No  . Drug use: No  . Sexual activity: Not on file  Lifestyle  . Physical activity:    Days per week: Not on file    Minutes per session: Not on file  . Stress: Not on file  Relationships  . Social connections:    Talks on phone: Not on file    Gets together: Not on file    Attends religious service: Not on file    Active member of club or organization: Not on file    Attends meetings of clubs or organizations: Not on file    Relationship status: Not on file  . Intimate partner violence:    Fear of current or ex partner: Not on file    Emotionally abused: Not on file    Physically abused: Not on file    Forced sexual activity: Not on file  Other Topics Concern  . Not on file  Social History Narrative  . Not on file    FAMILY HISTORY:  Family History  Problem Relation Age of Onset  . Breast cancer Sister   . Prostate cancer Father   . Liver cancer Brother   . Kidney cancer Brother   . Stomach cancer Sister     ALLERGIES:  has No Known Allergies.  MEDICATIONS:  Current Outpatient Medications  Medication Sig Dispense Refill  . TIZANIDINE HCL PO Take by mouth. As Needed    . aspirin 81 MG chewable tablet Chew 81 mg by mouth daily.    . Multiple Vitamin (MULTIVITAMIN WITH MINERALS) TABS Take 1 tablet by mouth daily.     No current facility-administered medications for this visit.     REVIEW OF SYSTEMS:   Constitutional: Denies fevers, chills or abnormal night sweats Eyes: Denies blurriness of vision, double  vision or watery eyes Ears, nose, mouth, throat, and face: Denies mucositis or sore throat Respiratory: Denies cough, dyspnea or wheezes Cardiovascular: Denies palpitation, chest discomfort or lower extremity swelling Gastrointestinal:  Denies nausea, heartburn or change in bowel habits Skin: Denies abnormal skin rashes Lymphatics: Denies new lymphadenopathy or easy bruising Neurological:Denies numbness, tingling or new weaknesses Behavioral/Psych: Mood is stable, no new changes  Breast:  Denies any palpable lumps or discharge All other systems were reviewed with the patient and are negative.  PHYSICAL EXAMINATION: ECOG PERFORMANCE STATUS: 0 - Asymptomatic  Vitals:   02/17/18 0902  BP: 140/78  Pulse: 76  Resp: 18  Temp: 98.8 F (37.1 C)  SpO2: 99%   Filed Weights   02/17/18 0902  Weight: 153 lb 9.6 oz (69.7 kg)    GENERAL:alert, no distress and comfortable SKIN: skin color, texture, turgor are normal, no rashes or significant lesions EYES: normal, conjunctiva are pink and non-injected, sclera clear OROPHARYNX:no exudate, no erythema and lips, buccal mucosa, and tongue normal  NECK: supple, thyroid normal size, non-tender, without nodularity LYMPH:  no palpable lymphadenopathy in the cervical, axillary or inguinal LUNGS: clear to auscultation and percussion with normal breathing effort HEART: regular rate & rhythm and no murmurs and no lower extremity edema ABDOMEN:abdomen soft, non-tender and normal bowel sounds Musculoskeletal:no cyanosis of digits and no clubbing  PSYCH: alert & oriented x 3 with fluent speech NEURO: no focal motor/sensory deficits BREAST: No palpable nodules in breast. No palpable axillary or supraclavicular lymphadenopathy (exam performed in the presence of a chaperone)   LABORATORY DATA:  I have reviewed the data as listed Lab Results  Component Value Date   WBC 3.0 (L) 02/17/2018   HGB 13.0 02/17/2018   HCT 39.3 02/17/2018   MCV 94.4 02/17/2018    PLT 217 02/17/2018   Lab Results  Component Value Date   NA 142 02/17/2018   K 3.9 02/17/2018   CL 105 02/17/2018   CO2 30 02/17/2018    RADIOGRAPHIC STUDIES: I have personally reviewed the radiological reports and agreed with the findings in the report.  ASSESSMENT AND PLAN:  Malignant neoplasm of upper-outer quadrant of left breast in Newman, estrogen receptor positive ([place]) 02/09/2018:Screening detected left breast mass UOQ at 12:30 position 9 mm size, axilla negative, ultrasound biopsy revealed grade 2 IDC ER 100%, PR 100%, Ki-67 2%, HER-2 negative IHC 1+, T1b N0 stage Ia AJCC 8  Pathology and radiology counseling:Discussed with the patient, the details of pathology including the type of breast cancer,the clinical staging, the significance of ER, PR and HER-2/neu receptors and the implications for treatment. After reviewing the pathology in detail, we proceeded to discuss the different treatment options between surgery, radiation, chemotherapy, antiestrogen  therapies.  Recommendations: 1. Breast conserving surgery followed by 2. Oncotype DX testing to determine if chemotherapy would be of any benefit followed by 3. Adjuvant radiation therapy followed by 4. Adjuvant antiestrogen therapy  Oncotype counseling: I discussed Oncotype DX test. I explained to the patient that this is a 21 gene panel to evaluate patient tumors DNA to calculate recurrence score. This would help determine whether patient has high risk or intermediate risk or low risk breast cancer. She understands that if her tumor was found to be high risk, she would benefit from systemic chemotherapy. If low risk, no need of chemotherapy. If she was found to be intermediate risk, we would need to evaluate the score as well as other risk factors and determine if an abbreviated chemotherapy may be of benefit.  Return to clinic after surgery to discuss final pathology report and then determine if Oncotype DX testing will need to  be sent.  All questions were answered. The patient knows to call the clinic with any problems, questions or concerns.    Harriette Ohara, MD 02/17/18

## 2018-02-18 ENCOUNTER — Encounter: Payer: Self-pay | Admitting: Genetic Counselor

## 2018-02-18 ENCOUNTER — Inpatient Hospital Stay (HOSPITAL_BASED_OUTPATIENT_CLINIC_OR_DEPARTMENT_OTHER): Payer: Medicare HMO | Admitting: Genetic Counselor

## 2018-02-18 ENCOUNTER — Telehealth: Payer: Self-pay | Admitting: *Deleted

## 2018-02-18 DIAGNOSIS — Z803 Family history of malignant neoplasm of breast: Secondary | ICD-10-CM

## 2018-02-18 DIAGNOSIS — Z8051 Family history of malignant neoplasm of kidney: Secondary | ICD-10-CM | POA: Insufficient documentation

## 2018-02-18 DIAGNOSIS — Z8042 Family history of malignant neoplasm of prostate: Secondary | ICD-10-CM

## 2018-02-18 DIAGNOSIS — Z170 Estrogen receptor positive status [ER+]: Secondary | ICD-10-CM | POA: Diagnosis not present

## 2018-02-18 DIAGNOSIS — Z800 Family history of malignant neoplasm of digestive organs: Secondary | ICD-10-CM | POA: Diagnosis not present

## 2018-02-18 DIAGNOSIS — C50412 Malignant neoplasm of upper-outer quadrant of left female breast: Secondary | ICD-10-CM | POA: Diagnosis not present

## 2018-02-18 NOTE — Telephone Encounter (Signed)
CALLED PATIENT TO INFORM OF APPT. WITH DR. Constance Holster ON 02-26-18 - ARRIVAL TIME - 2:30 PM, SPOKE WITH PATIENT AND SHE IS AWARE OF THIS APPT.

## 2018-02-18 NOTE — Progress Notes (Signed)
REFERRING PROVIDER: Gudena, Vinay, MD [address] [place], [place] [address]  PRIMARY PROVIDER:  Patient, No Pcp Per  PRIMARY REASON FOR VISIT:  1. Malignant neoplasm of upper-outer quadrant of left breast in female, estrogen receptor positive (HCC)   2. Family history of breast cancer   3. Family history of colon cancer   4. Family history of prostate cancer   5. Family history of kidney cancer      HISTORY OF PRESENT ILLNESS:   Kerry Newman, a 66 y.o. female, was seen for a  cancer genetics consultation at the request of Dr. Gudena due to a personal and family history of cancer.  Kerry Newman presents to clinic today to discuss the possibility of a hereditary predisposition to cancer, genetic testing, and to further clarify her future cancer risks, as well as potential cancer risks for family members.   In August 2019, at the age of 66, Kerry Newman was diagnosed with invasive ductal carcinoma of the left breast. This will be treated with lumpectomy.     CANCER HISTORY:    Malignant neoplasm of upper-outer quadrant of left breast in female, estrogen receptor positive (HCC)   02/09/2018 Initial Diagnosis    Screening detected left breast mass UOQ at 12:30 position 9 mm size, axilla negative, ultrasound biopsy revealed grade 2 IDC ER 100%, PR 100%, Ki-67 2%, HER-2 negative IHC 1+, T1b N0 stage Ia AJCC 8      HORMONAL RISK FACTORS:  Menarche was at age 12-13.  First live birth at age 25.  OCP use for approximately <5 years.  Ovaries intact: yes.  Hysterectomy: no.  Menopausal status: postmenopausal.  HRT use: 0 years. Colonoscopy: yes; normal. Mammogram within the last year: yes. Number of breast biopsies: 2. Up to date with pelvic exams:  yes. Any excessive radiation exposure in the past:  no  Past Medical History:  Diagnosis Date  . Family history of breast cancer   . Family history of colon cancer   . Family history of kidney cancer   . Family  history of prostate cancer   . Malignant neoplasm of upper-outer quadrant of left breast in female, estrogen receptor positive (HCC) 02/12/2018    Past Surgical History:  Procedure Laterality Date  . BREAST EXCISIONAL BIOPSY Left   . CESAREAN SECTION      Social History   Socioeconomic History  . Marital status: Married    Spouse name: Not on file  . Number of children: Not on file  . Years of education: Not on file  . Highest education level: Not on file  Occupational History  . Not on file  Social Needs  . Financial resource strain: Not on file  . Food insecurity:    Worry: Not on file    Inability: Not on file  . Transportation needs:    Medical: Not on file    Non-medical: Not on file  Tobacco Use  . Smoking status: Current Every Day Smoker  . Smokeless tobacco: Current User  Substance and Sexual Activity  . Alcohol use: No  . Drug use: No  . Sexual activity: Not on file  Lifestyle  . Physical activity:    Days per week: Not on file    Minutes per session: Not on file  . Stress: Not on file  Relationships  . Social connections:    Talks on phone: Not on file    Gets together: Not on file    Attends religious service: Not   on file    Active member of club or organization: Not on file    Attends meetings of clubs or organizations: Not on file    Relationship status: Not on file  Other Topics Concern  . Not on file  Social History Narrative  . Not on file     FAMILY HISTORY:  We obtained a detailed, 4-generation family history.  Significant diagnoses are listed below: Family History  Problem Relation Age of Onset  . Breast cancer Sister   . Prostate cancer Father   . Liver cancer Brother   . Kidney cancer Brother   . Stomach cancer Sister     The patient has a daughter and a son, who are cancer free.  She has four full brothers and five full sisters, and two paternal half brothers and a paternal half sister.  Her half sister was diagnosed with breast  cancer in her 23's.  One sister was diagnosed with stomach cancer, a full brother was diagnosed with prostate, colon and kidney cancer, a second full brother was diagnosed with kidney cancer.  Both parents are deceased.  Her mother died of complications from rheumatic fever and her father died of prostate cancer.    The patients mother had two sisters and three brothers.  None had cancer, but the patient has three maternal cousins who had breast cancer.  The maternal grandparents are deceased from non cancer related issues.  The patient's father had six full sisters and a paternal half sister and brother. The half sister had pancreatic cancer and the half brother had brain cancer.  The paternal grandparents are deceased.  Kerry Newman is aware of previous family history of genetic testing for hereditary cancer risks. Patient's maternal ancestors are of [place] American and Native American descent, and paternal ancestors are of Caucasian, African American and Native American descent. There is no reported Ashkenazi Jewish ancestry. There is no known consanguinity.  GENETIC COUNSELING ASSESSMENT: Kerry Newman is a 67 y.o. female with a personal and family history of cancer which is somewhat suggestive of a hereditary cancer syndrome and predisposition to cancer. We, therefore, discussed and recommended the following at today's visit.   DISCUSSION: We discussed that about 5-10% of breast cancer is hereditary with most cases due to BRCA mutations.  There are other genes that are associated with hereditary breast cancer genes, including ATM, CHEK2 and PALB2.  The patient's sister reportedly underwent genetic testing, but the patient did not know the results.  We reviewed the characteristics, features and inheritance patterns of hereditary cancer syndromes. We also discussed genetic testing, including the appropriate family members to test, the process of testing, insurance coverage and turn-around-time for  results. We discussed the implications of a negative, positive and/or variant of uncertain significant result. In order to get genetic test results in a timely manner so that Kerry Newman can use these genetic test results for surgical decisions, we recommended Kerry Newman pursue genetic testing for the STAT panel. If this test is negative, we then recommend Kerry Newman pursue reflex genetic testing to the multi cancer gene panel. The Multi-Gene Panel offered by [facility] includes sequencing and/or deletion duplication testing of the following 84 genes: AIP, ALK, APC, ATM, AXIN2,BAP1,  BARD1, BLM, BMPR1A, BRCA1, BRCA2, BRIP1, CASR, CDC73, CDH1, CDK4, CDKN1B, CDKN1C, CDKN2A (p14ARF), CDKN2A (p16INK4a), CEBPA, CHEK2, CTNNA1, DICER1, DIS3L2, EGFR (c.2369C>T, p.Thr790Met variant only), EPCAM (Deletion/duplication testing only), FH, FLCN, GATA2, GPC3, GREM1 (Promoter region deletion/duplication testing only), HOXB13 (c.251G>A, p.Gly84Glu), HRAS, KIT,  MAX, MEN1, MET, MITF (c.952G>A, p.Glu318Lys variant only), MLH1, MSH2, MSH3, MSH6, MUTYH, NBN, NF1, NF2, NTHL1, PALB2, PDGFRA, PHOX2B, PMS2, POLD1, POLE, POT1, PRKAR1A, PTCH1, PTEN, RAD50, RAD51C, RAD51D, RB1, RECQL4, RET, RUNX1, SDHAF2, SDHA (sequence changes only), SDHB, SDHC, SDHD, SMAD4, SMARCA4, SMARCB1, SMARCE1, STK11, SUFU, TERC, TERT, TMEM127, TP53, TSC1, TSC2, VHL, WRN and WT1.    Based on Kerry Newman's personal and family history of cancer, she meets medical criteria for genetic testing. Despite that she meets criteria, she may still have an out of pocket cost. We discussed that if her out of pocket cost for testing is over $100, the laboratory will call and confirm whether she wants to proceed with testing.  If the out of pocket cost of testing is less than $100 she will be billed by the genetic testing laboratory.   PLAN: After considering the risks, benefits, and limitations, Kerry Newman  provided informed consent to pursue genetic testing and the blood sample was sent  to [facility] for analysis of the STAT and Multi cancer gene panel. Results should be available within approximately 5-12 days for the STAT panel, and up to 2 additional weeks' time, at which point they will be disclosed by telephone to Kerry Newman, as will any additional recommendations warranted by these results. Kerry Newman will receive a summary of her genetic counseling visit and a copy of her results once available. This information will also be available in Epic. We encouraged Kerry Newman to remain in contact with cancer genetics annually so that we can continuously update the family history and inform her of any changes in cancer genetics and testing that may be of benefit for her family. Kerry Newman's questions were answered to her satisfaction today. Our contact information was provided should additional questions or concerns arise.  Lastly, we encouraged Kerry Newman to remain in contact with cancer genetics annually so that we can continuously update the family history and inform her of any changes in cancer genetics and testing that may be of benefit for this family.   Ms.  Newman's questions were answered to her satisfaction today. Our contact information was provided should additional questions or concerns arise. Thank you for the referral and allowing us to share in the care of your patient.    P. , MS, CGC Certified Genetic Counselor [email or web address] phone: [phone]  The patient was seen for a total of 55 minutes in face-to-face genetic counseling.  This patient was discussed with Drs. Magrinat, Gudena and/or Feng who agrees with the above.    _______________________________________________________________________ For Office Staff:  Number of people involved in session: 1 Was an Intern/ student involved with case: no   

## 2018-02-21 HISTORY — PX: BREAST LUMPECTOMY: SHX2

## 2018-02-25 ENCOUNTER — Telehealth: Payer: Self-pay | Admitting: *Deleted

## 2018-02-25 NOTE — Telephone Encounter (Signed)
  Oncology Nurse Navigator Documentation  Navigator Location: [facility] (02/25/18 1200)   )Navigator Encounter Type: Telephone;[facility] Follow-up (02/25/18 1200) Telephone: Outgoing Call;Clinic/[facility] Follow-up (02/25/18 1200)                                                  Time Spent with Patient: 15 (02/25/18 1200)

## 2018-02-26 DIAGNOSIS — R222 Localized swelling, mass and lump, trunk: Secondary | ICD-10-CM | POA: Insufficient documentation

## 2018-03-01 ENCOUNTER — Other Ambulatory Visit: Payer: Self-pay | Admitting: Surgery

## 2018-03-01 ENCOUNTER — Other Ambulatory Visit: Payer: Self-pay

## 2018-03-01 ENCOUNTER — Telehealth: Payer: Self-pay | Admitting: Genetic Counselor

## 2018-03-01 ENCOUNTER — Encounter (HOSPITAL_BASED_OUTPATIENT_CLINIC_OR_DEPARTMENT_OTHER): Payer: Self-pay | Admitting: *Deleted

## 2018-03-01 DIAGNOSIS — C50912 Malignant neoplasm of unspecified site of left female breast: Secondary | ICD-10-CM

## 2018-03-01 DIAGNOSIS — Z170 Estrogen receptor positive status [ER+]: Principal | ICD-10-CM

## 2018-03-01 NOTE — Telephone Encounter (Signed)
Revealed negative genetic testing on the STAT panel.  Explained that we are still waiting on the remainder of her genetic testing panel.  We will call as soon as we get that back.

## 2018-03-02 ENCOUNTER — Telehealth: Payer: Self-pay | Admitting: Hematology and Oncology

## 2018-03-02 NOTE — Telephone Encounter (Signed)
Appt added patient notified per 9/9 sch msg

## 2018-03-05 ENCOUNTER — Ambulatory Visit
Admission: RE | Admit: 2018-03-05 | Discharge: 2018-03-05 | Disposition: A | Discharge status: Home / Self Care | Payer: Medicare HMO | Source: Ambulatory Visit | Attending: Surgery | Admitting: Surgery

## 2018-03-05 DIAGNOSIS — C50912 Malignant neoplasm of unspecified site of left female breast: Secondary | ICD-10-CM

## 2018-03-05 DIAGNOSIS — Z170 Estrogen receptor positive status [ER+]: Principal | ICD-10-CM

## 2018-03-08 NOTE — Progress Notes (Signed)
Patient given ENSURE pre-surgery drink and advised to drink at 0630am DOS.  Patient also given surgical soap with directions on how to use.  Patient verbalized understanding on both.

## 2018-03-09 ENCOUNTER — Ambulatory Visit (HOSPITAL_BASED_OUTPATIENT_CLINIC_OR_DEPARTMENT_OTHER)
Admission: RE | Admit: 2018-03-09 | Discharge: 2018-03-09 | Disposition: A | Discharge status: Home / Self Care | Payer: Medicare HMO | Source: Ambulatory Visit | Attending: Surgery | Admitting: Surgery

## 2018-03-09 ENCOUNTER — Ambulatory Visit (HOSPITAL_BASED_OUTPATIENT_CLINIC_OR_DEPARTMENT_OTHER): Payer: Medicare HMO | Admitting: Certified Registered"

## 2018-03-09 ENCOUNTER — Ambulatory Visit
Admission: RE | Admit: 2018-03-09 | Discharge: 2018-03-09 | Disposition: A | Discharge status: Home / Self Care | Payer: Medicare HMO | Source: Ambulatory Visit | Attending: Surgery | Admitting: Surgery

## 2018-03-09 ENCOUNTER — Encounter (HOSPITAL_COMMUNITY)
Admission: RE | Admit: 2018-03-09 | Discharge: 2018-03-09 | Disposition: A | Discharge status: Home / Self Care | Payer: Medicare HMO | Source: Ambulatory Visit | Attending: Surgery | Admitting: Surgery

## 2018-03-09 ENCOUNTER — Encounter (HOSPITAL_BASED_OUTPATIENT_CLINIC_OR_DEPARTMENT_OTHER): Payer: Self-pay | Admitting: Certified Registered"

## 2018-03-09 ENCOUNTER — Other Ambulatory Visit: Payer: Self-pay

## 2018-03-09 ENCOUNTER — Encounter (HOSPITAL_BASED_OUTPATIENT_CLINIC_OR_DEPARTMENT_OTHER)
Admission: RE | Disposition: A | Discharge status: Home / Self Care | Payer: Self-pay | Source: Ambulatory Visit | Attending: Surgery

## 2018-03-09 DIAGNOSIS — Z800 Family history of malignant neoplasm of digestive organs: Secondary | ICD-10-CM | POA: Insufficient documentation

## 2018-03-09 DIAGNOSIS — C50412 Malignant neoplasm of upper-outer quadrant of left female breast: Secondary | ICD-10-CM | POA: Insufficient documentation

## 2018-03-09 DIAGNOSIS — Z170 Estrogen receptor positive status [ER+]: Principal | ICD-10-CM

## 2018-03-09 DIAGNOSIS — Z803 Family history of malignant neoplasm of breast: Secondary | ICD-10-CM | POA: Insufficient documentation

## 2018-03-09 DIAGNOSIS — C50912 Malignant neoplasm of unspecified site of left female breast: Secondary | ICD-10-CM

## 2018-03-09 DIAGNOSIS — Z8041 Family history of malignant neoplasm of ovary: Secondary | ICD-10-CM | POA: Insufficient documentation

## 2018-03-09 HISTORY — PX: BREAST LUMPECTOMY WITH RADIOACTIVE SEED AND SENTINEL LYMPH NODE BIOPSY: SHX6550

## 2018-03-09 SURGERY — BREAST LUMPECTOMY WITH RADIOACTIVE SEED AND SENTINEL LYMPH NODE BIOPSY
Anesthesia: General | Site: Breast | Laterality: Left

## 2018-03-09 MED ORDER — SCOPOLAMINE 1 MG/3DAYS TD PT72: 1.0000 | MEDICATED_PATCH | Freq: Once | TRANSDERMAL | Status: DC | PRN

## 2018-03-09 MED ORDER — SODIUM CHLORIDE 0.9 % IJ SOLN
INTRAVENOUS | Status: DC | PRN
  Administered 2018-03-09: 1 mL via INTRADERMAL

## 2018-03-09 MED ORDER — MIDAZOLAM HCL 2 MG/2ML IJ SOLN
1.0000 mg | INTRAMUSCULAR | Status: DC | PRN
  Administered 2018-03-09: 1 mg via INTRAVENOUS

## 2018-03-09 MED ORDER — ACETAMINOPHEN 500 MG PO TABS
ORAL_TABLET | ORAL | Status: AC
  Filled 2018-03-09: qty 2

## 2018-03-09 MED ORDER — EPHEDRINE SULFATE 50 MG/ML IJ SOLN
INTRAMUSCULAR | Status: DC | PRN
  Administered 2018-03-09 (×2): 10 mg via INTRAVENOUS

## 2018-03-09 MED ORDER — GABAPENTIN 300 MG PO CAPS
300.0000 mg | ORAL_CAPSULE | ORAL | Status: AC
  Administered 2018-03-09: 300 mg via ORAL

## 2018-03-09 MED ORDER — CEFAZOLIN SODIUM-DEXTROSE 2-4 GM/100ML-% IV SOLN
INTRAVENOUS | Status: AC
  Filled 2018-03-09: qty 100

## 2018-03-09 MED ORDER — BUPIVACAINE-EPINEPHRINE (PF) 0.25% -1:200000 IJ SOLN
INTRAMUSCULAR | Status: DC | PRN
  Administered 2018-03-09: 20 mL

## 2018-03-09 MED ORDER — FENTANYL CITRATE (PF) 100 MCG/2ML IJ SOLN
50.0000 ug | INTRAMUSCULAR | Status: DC | PRN
  Administered 2018-03-09 (×2): 50 ug via INTRAVENOUS

## 2018-03-09 MED ORDER — METHYLENE BLUE 0.5 % INJ SOLN
INTRAVENOUS | Status: AC
  Filled 2018-03-09: qty 10

## 2018-03-09 MED ORDER — TECHNETIUM TC 99M SULFUR COLLOID FILTERED
1.0000 | Freq: Once | INTRAVENOUS | Status: AC | PRN
  Administered 2018-03-09: 1 via INTRADERMAL

## 2018-03-09 MED ORDER — CEFAZOLIN SODIUM-DEXTROSE 2-4 GM/100ML-% IV SOLN
2.0000 g | INTRAVENOUS | Status: AC
  Administered 2018-03-09: 2 g via INTRAVENOUS

## 2018-03-09 MED ORDER — GABAPENTIN 300 MG PO CAPS
ORAL_CAPSULE | ORAL | Status: AC
  Filled 2018-03-09: qty 1

## 2018-03-09 MED ORDER — LACTATED RINGERS IV SOLN
INTRAVENOUS | Status: DC
  Administered 2018-03-09 (×3): via INTRAVENOUS

## 2018-03-09 MED ORDER — BUPIVACAINE-EPINEPHRINE (PF) 0.25% -1:200000 IJ SOLN
INTRAMUSCULAR | Status: AC
  Filled 2018-03-09: qty 30

## 2018-03-09 MED ORDER — FENTANYL CITRATE (PF) 100 MCG/2ML IJ SOLN
INTRAMUSCULAR | Status: AC
  Filled 2018-03-09: qty 2

## 2018-03-09 MED ORDER — PHENYLEPHRINE HCL 10 MG/ML IJ SOLN
INTRAMUSCULAR | Status: DC | PRN
  Administered 2018-03-09: 40 ug via INTRAVENOUS

## 2018-03-09 MED ORDER — HYDROCODONE-ACETAMINOPHEN 5-325 MG PO TABS: 1.0000 | ORAL_TABLET | Freq: Four times a day (QID) | ORAL | 0 refills | Status: DC | PRN

## 2018-03-09 MED ORDER — DEXAMETHASONE SODIUM PHOSPHATE 4 MG/ML IJ SOLN
INTRAMUSCULAR | Status: DC | PRN
  Administered 2018-03-09: 8 mg via INTRAVENOUS

## 2018-03-09 MED ORDER — BUPIVACAINE-EPINEPHRINE (PF) 0.5% -1:200000 IJ SOLN
INTRAMUSCULAR | Status: DC | PRN
  Administered 2018-03-09: 30 mL

## 2018-03-09 MED ORDER — OXYCODONE HCL 5 MG/5ML PO SOLN: 5.0000 mg | Freq: Once | ORAL | Status: AC | PRN

## 2018-03-09 MED ORDER — ACETAMINOPHEN 500 MG PO TABS
1000.0000 mg | ORAL_TABLET | ORAL | Status: AC
  Administered 2018-03-09: 1000 mg via ORAL

## 2018-03-09 MED ORDER — MIDAZOLAM HCL 2 MG/2ML IJ SOLN
INTRAMUSCULAR | Status: AC
  Filled 2018-03-09: qty 2

## 2018-03-09 MED ORDER — CHLORHEXIDINE GLUCONATE CLOTH 2 % EX PADS: 6.0000 | MEDICATED_PAD | Freq: Once | CUTANEOUS | Status: DC

## 2018-03-09 MED ORDER — LIDOCAINE 2% (20 MG/ML) 5 ML SYRINGE
INTRAMUSCULAR | Status: DC | PRN
  Administered 2018-03-09: 60 mg via INTRAVENOUS

## 2018-03-09 MED ORDER — SODIUM CHLORIDE 0.9 % IJ SOLN
INTRAMUSCULAR | Status: AC
  Filled 2018-03-09: qty 10

## 2018-03-09 MED ORDER — PROMETHAZINE HCL 25 MG/ML IJ SOLN: 6.2500 mg | INTRAMUSCULAR | Status: DC | PRN

## 2018-03-09 MED ORDER — OXYCODONE HCL 5 MG PO TABS
5.0000 mg | ORAL_TABLET | Freq: Once | ORAL | Status: AC | PRN
  Administered 2018-03-09: 5 mg via ORAL

## 2018-03-09 MED ORDER — OXYCODONE HCL 5 MG PO TABS
ORAL_TABLET | ORAL | Status: AC
  Filled 2018-03-09: qty 1

## 2018-03-09 MED ORDER — FENTANYL CITRATE (PF) 100 MCG/2ML IJ SOLN
25.0000 ug | INTRAMUSCULAR | Status: DC | PRN
  Administered 2018-03-09: 50 ug via INTRAVENOUS

## 2018-03-09 SURGICAL SUPPLY — 53 items
ADH SKN CLS APL DERMABOND .7 (GAUZE/BANDAGES/DRESSINGS) ×1
APL SKNCLS STERI-STRIP NONHPOA (GAUZE/BANDAGES/DRESSINGS)
BENZOIN TINCTURE PRP APPL 2/3 (GAUZE/BANDAGES/DRESSINGS) IMPLANT
BINDER BREAST LRG (GAUZE/BANDAGES/DRESSINGS) ×3 IMPLANT
BINDER BREAST MEDIUM (GAUZE/BANDAGES/DRESSINGS) IMPLANT
BINDER BREAST XLRG (GAUZE/BANDAGES/DRESSINGS) IMPLANT
BINDER BREAST XXLRG (GAUZE/BANDAGES/DRESSINGS) IMPLANT
BLADE SURG 15 STRL LF DISP TIS (BLADE) ×1 IMPLANT
BLADE SURG 15 STRL SS (BLADE) ×3
CANISTER SUC SOCK COL 7IN (MISCELLANEOUS) IMPLANT
CANISTER SUCT 1200ML W/VALVE (MISCELLANEOUS) ×3 IMPLANT
CHLORAPREP W/TINT 26ML (MISCELLANEOUS) ×3 IMPLANT
CLIP VESOCCLUDE SM WIDE 6/CT (CLIP) ×3 IMPLANT
CLOSURE WOUND 1/2 X4 (GAUZE/BANDAGES/DRESSINGS)
COVER BACK TABLE 60X90IN (DRAPES) ×3 IMPLANT
COVER MAYO STAND STRL (DRAPES) ×3 IMPLANT
COVER PROBE W GEL 5X96 (DRAPES) ×3 IMPLANT
DECANTER SPIKE VIAL GLASS SM (MISCELLANEOUS) IMPLANT
DERMABOND ADVANCED (GAUZE/BANDAGES/DRESSINGS) ×2
DERMABOND ADVANCED .7 DNX12 (GAUZE/BANDAGES/DRESSINGS) ×1 IMPLANT
DEVICE DUBIN W/COMP PLATE 8390 (MISCELLANEOUS) ×3 IMPLANT
DRAPE LAPAROSCOPIC ABDOMINAL (DRAPES) ×3 IMPLANT
DRAPE UTILITY XL STRL (DRAPES) ×3 IMPLANT
DRSG PAD ABDOMINAL 8X10 ST (GAUZE/BANDAGES/DRESSINGS) ×3 IMPLANT
ELECT COATED BLADE 2.86 ST (ELECTRODE) ×3 IMPLANT
ELECT REM PT RETURN 9FT ADLT (ELECTROSURGICAL) ×3
ELECTRODE REM PT RTRN 9FT ADLT (ELECTROSURGICAL) ×1 IMPLANT
GAUZE SPONGE 4X4 12PLY STRL (GAUZE/BANDAGES/DRESSINGS) ×3 IMPLANT
GLOVE BIO SURGEON STRL SZ7 (GLOVE) ×6 IMPLANT
GLOVE SURG SIGNA 7.5 PF LTX (GLOVE) ×6 IMPLANT
GOWN STRL REUS W/ TWL LRG LVL3 (GOWN DISPOSABLE) IMPLANT
GOWN STRL REUS W/ TWL XL LVL3 (GOWN DISPOSABLE) ×2 IMPLANT
GOWN STRL REUS W/TWL LRG LVL3 (GOWN DISPOSABLE)
GOWN STRL REUS W/TWL XL LVL3 (GOWN DISPOSABLE) ×6
KIT MARKER MARGIN INK (KITS) ×3 IMPLANT
NDL SAFETY ECLIPSE 18X1.5 (NEEDLE) IMPLANT
NEEDLE HYPO 18GX1.5 SHARP (NEEDLE)
NEEDLE HYPO 25X1 1.5 SAFETY (NEEDLE) ×9 IMPLANT
NS IRRIG 1000ML POUR BTL (IV SOLUTION) ×3 IMPLANT
PACK BASIN DAY SURGERY FS (CUSTOM PROCEDURE TRAY) ×3 IMPLANT
PENCIL BUTTON HOLSTER BLD 10FT (ELECTRODE) ×3 IMPLANT
SHEET MEDIUM DRAPE 40X70 STRL (DRAPES) ×3 IMPLANT
SLEEVE SCD COMPRESS KNEE MED (MISCELLANEOUS) ×3 IMPLANT
SPONGE LAP 18X18 RF (DISPOSABLE) ×3 IMPLANT
STRIP CLOSURE SKIN 1/2X4 (GAUZE/BANDAGES/DRESSINGS) IMPLANT
SUT MNCRL AB 4-0 PS2 18 (SUTURE) ×3 IMPLANT
SUT VICRYL 3-0 CR8 SH (SUTURE) ×3 IMPLANT
SYR CONTROL 10ML LL (SYRINGE) ×3 IMPLANT
TOWEL GREEN STERILE FF (TOWEL DISPOSABLE) ×3 IMPLANT
TOWEL OR NON WOVEN STRL DISP B (DISPOSABLE) IMPLANT
TUBE CONNECTING 20'X1/4 (TUBING) ×1
TUBE CONNECTING 20X1/4 (TUBING) ×2 IMPLANT
YANKAUER SUCT BULB TIP NO VENT (SUCTIONS) ×3 IMPLANT

## 2018-03-09 NOTE — Progress Notes (Signed)
Assisted Dr. Fransisco Beau with left, ultrasound guided, pectoralis block. Side rails up, monitors on throughout procedure. See vital signs in flow sheet. Tolerated Procedure well.

## 2018-03-09 NOTE — Transfer of Care (Signed)
Immediate Anesthesia Transfer of Care Note  Patient: Kerry Newman  Procedure(s) Performed: LEFT BREAST LUMPECTOMY WITH RADIOACTIVE SEED AND SENTINEL LYMPH NODE BIOPSY (Left Breast)  Patient Location: PACU  Anesthesia Type:GA combined with regional for post-op pain  Level of Consciousness: awake, alert , oriented and drowsy  Airway & Oxygen Therapy: Patient Spontanous Breathing and Patient connected to face mask oxygen  Post-op Assessment: Report given to RN and Post -op Vital signs reviewed and stable  Post vital signs: Reviewed and stable  Last Vitals:  Vitals Value Taken Time  BP 139/79 03/09/2018 11:50 AM  Temp    Pulse 74 03/09/2018 11:51 AM  Resp 14 03/09/2018 11:51 AM  SpO2 100 % 03/09/2018 11:51 AM  Vitals shown include unvalidated device data.  Last Pain:  Vitals:   03/09/18 0855  PainSc: 0-No pain      Patients Stated Pain Goal: 0 (15/94/58 5929)  Complications: No apparent anesthesia complications

## 2018-03-09 NOTE — Anesthesia Procedure Notes (Signed)
Anesthesia Regional Block: Pectoralis block   Pre-Anesthetic Checklist: ,, timeout performed, Correct Patient, Correct Site, Correct Laterality, Correct Procedure, Correct Position, site marked, Risks and benefits discussed,  Surgical consent,  Pre-op evaluation,  At surgeon's request and post-op pain management  Laterality: Left  Prep: chloraprep       Needles:  Injection technique: Single-shot  Needle Type: Echogenic Needle     Needle Length: 10cm  Needle Gauge: 21     Additional Needles:   Narrative:  Start time: 03/09/2018 9:44 AM End time: 03/09/2018 9:48 AM Injection made incrementally with aspirations every 5 mL.  Performed by: Personally  Anesthesiologist: Audry Pili, MD  Additional Notes: No pain on injection. No increased resistance to injection. Injection made in 5cc increments. Good needle visualization. Patient tolerated the procedure well.

## 2018-03-09 NOTE — Discharge Instructions (Signed)
[facility] - DISCHARGE INSTRUCTIONS TO PATIENT  Activity:  Driving - May drive in 2 - 3 days, if doing well   Lifting - No lifting more than 15 pounds for 1 week  Wound Care:   Leave bandage for 2 days.  Then remove bandage and shower.  Diet:  As tolerated  Follow up appointment:  Call Dr. Pollie Friar office [facility]) at [phone] for an appointment in 2 to 3 weeks.  Medications and dosages:  Resume your home medications.  You have a prescription for:  Vicodin  Call Dr. Lucia Gaskins or his office  [phone]) if you have:  Temperature greater than 100.4,  Persistent nausea and vomiting,  Severe uncontrolled pain,  Redness, tenderness, or signs of infection (pain, swelling, redness, odor or green/yellow discharge around the site),  Difficulty breathing, headache or visual disturbances,  Any other questions or concerns you may have after discharge.  In an emergency, call 911 or go to an Emergency Department at a nearby hospital.     Post Anesthesia Home Care Instructions  Activity: Get plenty of rest for the remainder of the day. A responsible individual must stay with you for 24 hours following the procedure.  For the next 24 hours, DO NOT: -Drive a car -Paediatric nurse -Drink alcoholic beverages -Take any medication unless instructed by your physician -Make any legal decisions or sign important papers.  Meals: Start with liquid foods such as gelatin or soup. Progress to regular foods as tolerated. Avoid greasy, spicy, heavy foods. If nausea and/or vomiting occur, drink only clear liquids until the nausea and/or vomiting subsides. Call your physician if vomiting continues.  Special Instructions/Symptoms: Your throat may feel dry or sore from the anesthesia or the breathing tube placed in your throat during surgery. If this causes discomfort, gargle with warm salt water. The discomfort should disappear within 24 hours.  If you had a  scopolamine patch placed behind your ear for the management of post- operative nausea and/or vomiting:  1. The medication in the patch is effective for 72 hours, after which it should be removed.  Wrap patch in a tissue and discard in the trash. Wash hands thoroughly with soap and water. 2. You may remove the patch earlier than 72 hours if you experience unpleasant side effects which may include dry mouth, dizziness or visual disturbances. 3. Avoid touching the patch. Wash your hands with soap and water after contact with the patch.     Regional Anesthesia Blocks  1. Numbness or the inability to move the "blocked" extremity may last from 3-48 hours after placement. The length of time depends on the medication injected and your individual response to the medication. If the numbness is not going away after 48 hours, call your surgeon.  2. The extremity that is blocked will need to be protected until the numbness is gone and the  Strength has returned. Because you cannot feel it, you will need to take extra care to avoid injury. Because it may be weak, you may have difficulty moving it or using it. You may not know what position it is in without looking at it while the block is in effect.  3. For blocks in the legs and feet, returning to weight bearing and walking needs to be done carefully. You will need to wait until the numbness is entirely gone and the strength has returned. You should be able to move your leg and foot normally before you try and bear weight or walk.  You will need someone to be with you when you first try to ensure you do not fall and possibly risk injury.  4. Bruising and tenderness at the needle site are common side effects and will resolve in a few days.  5. Persistent numbness or new problems with movement should be communicated to the surgeon or the [facility] [phone] [place] [phone]).

## 2018-03-09 NOTE — Interval H&P Note (Signed)
History and Physical Interval Note:  03/09/2018 10:05 AM  Kerry Newman  has presented today for surgery, with the diagnosis of LEFT BREAST CANCER  The various methods of treatment have been discussed with the patient and family.  Husband and daughter at bedside.  Seed in place.   After consideration of risks, benefits and other options for treatment, the patient has consented to  Procedure(s): LEFT BREAST LUMPECTOMY WITH RADIOACTIVE SEED AND SENTINEL LYMPH NODE BIOPSY (Left) as a surgical intervention .  The patient's history has been reviewed, patient examined, no change in status, stable for surgery.  I have reviewed the patient's chart and labs.  Questions were answered to the patient's satisfaction.     Shann Medal

## 2018-03-09 NOTE — Anesthesia Postprocedure Evaluation (Signed)
Anesthesia Post Note  Patient: Kerry Newman  Procedure(s) Performed: LEFT BREAST LUMPECTOMY WITH RADIOACTIVE SEED AND SENTINEL LYMPH NODE BIOPSY (Left Breast)     Patient location during evaluation: PACU Anesthesia Type: General Level of consciousness: awake and alert and oriented Pain management: pain level controlled Vital Signs Assessment: post-procedure vital signs reviewed and stable Respiratory status: spontaneous breathing, nonlabored ventilation and respiratory function stable Cardiovascular status: blood pressure returned to baseline and stable Postop Assessment: no apparent nausea or vomiting Anesthetic complications: no    Last Vitals:  Vitals:   03/09/18 1223 03/09/18 1243  BP: (!) 160/79 (!) 155/77  Pulse: 62 67  Resp: 14 16  Temp:  36.4 C  SpO2: 100% 100%    Last Pain:  Vitals:   03/09/18 1243  PainSc: 4                  Fadil Macmaster A.

## 2018-03-09 NOTE — Anesthesia Preprocedure Evaluation (Addendum)
Anesthesia Evaluation  Patient identified by MRN, date of birth, ID band Patient awake    Reviewed: Allergy & Precautions, NPO status , Patient's Chart, lab work & pertinent test results  History of Anesthesia Complications Negative for: history of anesthetic complications  Airway Mallampati: II  TM Distance: >3 FB Neck ROM: Full    Dental  (+) Dental Advisory Given   Pulmonary neg pulmonary ROS,    breath sounds clear to auscultation       Cardiovascular negative cardio ROS   Rhythm:Regular Rate:Normal     Neuro/Psych negative neurological ROS  negative psych ROS   GI/Hepatic negative GI ROS, Neg liver ROS,   Endo/Other  negative endocrine ROS  Renal/GU negative Renal ROS  negative genitourinary   Musculoskeletal negative musculoskeletal ROS (+)   Abdominal   Peds  Hematology negative hematology ROS (+)   Anesthesia Other Findings   Reproductive/Obstetrics  Left breast cancer                             Anesthesia Physical Anesthesia Plan  ASA: II  Anesthesia Plan: General   Post-op Pain Management:  Regional for Post-op pain   Induction: Intravenous  PONV Risk Score and Plan: 3 and Treatment may vary due to age or medical condition, Ondansetron and Dexamethasone  Airway Management Planned: LMA  Additional Equipment: None  Intra-op Plan:   Post-operative Plan: Extubation in OR  Informed Consent: I have reviewed the patients History and Physical, chart, labs and discussed the procedure including the risks, benefits and alternatives for the proposed anesthesia with the patient or authorized representative who has indicated his/her understanding and acceptance.   Dental advisory given  Plan Discussed with: CRNA and Anesthesiologist  Anesthesia Plan Comments:        Anesthesia Quick Evaluation

## 2018-03-09 NOTE — Op Note (Signed)
03/09/2018  11:37 AM  PATIENT:  Kerry Newman DOB: 29-May-1951 MRN: [id number]  PREOP DIAGNOSIS:   LEFT BREAST CANCER  POSTOP DIAGNOSIS:    Left breast cancer, 2 o'clock position (T1, N0)  PROCEDURE:   Procedure(s):  LEFT BREAST LUMPECTOMY WITH RADIOACTIVE SEED AND SENTINEL LYMPH NODE BIOPSY, Injection of peri areolar area of breast with methylene blue (1.0 cc), deep sentinel lymph node biopsy  SURGEON:   Alphonsa Overall, M.D.  ANESTHESIA:   general  Anesthesiologist: Audry Pili, MD CRNA: Willa Frater, CRNA  General  EBL:  75  ml  DRAINS:  none   LOCAL MEDICATIONS USED:   Left pectoral block by anesthesia, 20 cc 1/4% marcaine  SPECIMEN:   Left breast lumpecotmy (6 color paint), inferior margin, left axillary SLNBx (counts 470, background 0, not blue)  COUNTS CORRECT:  YES  INDICATIONS FOR PROCEDURE:  Kerry Newman is a 67 y.o. (DOB: 1951/01/20) AA female whose primary care physician is Kerry Ebbs, MD and comes for left breast lumpectomy and left axillary sentinel lymph node biopsy.   She was seen at the [facility] for a left breast cancer.  Drs. Kerry Newman and Kerry Newman was her for oncology. The options for breast cancer treatment have been discussed with the patient. She elected to proceed with lumpectomy and axillary sentinel lymph node.     The indications and potential complications of surgery were explained to the patient. Potential complications include, but are not limited to, bleeding, infection, the need for further surgery, and nerve injury.     She had a I131 seed placed on 03/05/2018 in her left breast at [facility].  The seed is in the 2 o'clock position of the left breast.   In the holding area, her left areola was injected with 1 millicurie of Technitium Sulfur Colloid.  OPERATIVE NOTE:   The patient was taken to operating room # 7 at [facility] where she underwent a general anesthesia  supervised by Anesthesiologist: Audry Pili, MD CRNA: Willa Frater, CRNA. Her left breast and axilla were prepped with  ChloraPrep and sterilely draped.    A time-out and the surgical check list was reviewed.    I injected about 1.0 mL of 40% methylene blue around her left areola.   I turned attention to the cancer which was about at the 2 o'clock position of the left breast.   I used the Neoprobe to identify the I131 seed.  I made an incision in the UOQ of the left breast, about 4 cm off the areola.  I tried to excise an area around the tumor of at least 1 cm.    I excised this block of breast tissue approximately 4 cm by 5 cm  in diameter.   I painted the lumpectomy specimen with the 6 color paint kit and did a specimen mammogram which confirmed the mass, clip, and the seed were all in the right position in the specimen.  The specimen was sent to pathology who called back to confirm that they have the seed and the specimen.  I did take the excision down to the chest wall.   The tumor looked closest to the inferior margin, therefore I took additional inferior margin.  I marked the margin with a suture anteriorly and painted the inferior margin.   I then started the left deep axillary sentinel lymph node biopsy. I made an incision in the left axilla.  I found a hot  area at the junction of the breast and the pectoralis major muscle, deep in the axilla. I cut down and  identified a hot node that had counts of 470 and the background has 0 counts. The lymph node was not blue. I checked her internal mammary nodes and supraclavicular nodes with the neoprobe and found no other hot area. The axillary node was then sent to pathology.    I then irrigated the wound with saline. I infiltrated approximately 30 mL of 1/4% Marcaine between the incisions. I placed 4 clips to mark biopsy cavity, at 12, 3, 6, and 9 o'clock.  I then closed all the wounds in layers using 3-0 Vicryl sutures for the deep layer. At the skin, I closed the incisions with a 4-0  Monocryl suture. The incisions were then painted with Dermabond.  She had gauze place over the wounds and placed in a breast binder.   The patient tolerated the procedure well, was transported to the recovery room in good condition. Sponge and needle count were correct at the end of the case.   Final pathology is pending.   Alphonsa Overall, MD, [facility] Surgery Pager: [phone] Office phone:  [phone]

## 2018-03-09 NOTE — Anesthesia Procedure Notes (Signed)
Procedure Name: LMA Insertion Date/Time: 03/09/2018 10:16 AM Performed by: Willa Frater, CRNA Pre-anesthesia Checklist: Patient identified, Emergency Drugs available, Suction available and Patient being monitored Patient Re-evaluated:Patient Re-evaluated prior to induction Oxygen Delivery Method: Circle system utilized Preoxygenation: Pre-oxygenation with 100% oxygen Induction Type: IV induction Ventilation: Mask ventilation without difficulty LMA: LMA inserted LMA Size: 4.0 Number of attempts: 1 Airway Equipment and Method: Bite block Placement Confirmation: positive ETCO2 Tube secured with: Tape Dental Injury: Teeth and Oropharynx as per pre-operative assessment

## 2018-03-09 NOTE — H&P (Signed)
Kerry Newman  Location: [facility] Patient #: [id number] DOB: 06/25/1950 Undefined / Language: Kerry Newman / Race: Black or African American Female  History of Present Illness   The patient is a 67 year old female who presents with a complaint of breast cancer.  The PCP is Dr. Revonda Humphrey  The patient was referred by Dr. Reynolds Bowl  The pateint is at the [facility] - Oncology is Drs. Lindi Adie and Isidore Moos She comes with her husband, Kerry Newman  Her last mammogram was about 2 years ago. She did not feel a mass in her breast. She has a sister, Kerry Newman, who has metastatic breast cancer and sees Dr. Lindi Adie. She had a prior left breast biopsy at the 9 o'clock position when she was 67 yo. This was benign.  Mammograms: The [employer] [facility] - 02/09/2018 - 9 mm mass at 12:30 in the left breast, 5 cm from the nipple Biopsy: on 02/09/2018 - [id number] - IDC, grade 2, ER - 100%, PR - 100%, Ki67 - 2% Family history of breast or ovarian cancer: She has a sister who has breast cancer (sees Dr. Lindi Adie - Kerry Newman). She has a sister with stomach ca, a brother with metastatic colon ca. On hormone Newman: No  I discussed the options for breast cancer treatment with the patient. The patient is at the [facility], which includes medical oncology and radiation oncology. I discussed the surgical options of lumpectomy vs. mastectomy. If mastectomy, there is the possibility of reconstruction. I discussed the options of lymph node biopsy. The treatment plan depends on the pathologic staging of the tumor and the patient's personal wishes. The risks of surgery include, but are not limited to, bleeding, infection, the need for further surgery, and nerve injury. The patient has been given literature on the treatment of breast cancer.  Plan: 1) Left breast lumpectomy (seed localization) with left axillary SLNBx, 2) Rad tx, 3) Oncotype  Past  Medical History: 1. Colonoscopy - 2018 - Hung 2. She feels a lump in her throat at times.  Social History: She comes with her husband, Kerry Newman She has two children: 67 yo daughter (Kerry therapist(?)), and 52 yo son in [place] in acting  She is retired from [employer]  Past Surgical History Kerry Pummel, RN; 02/17/2018 7:33 AM) Breast Biopsy  Left. Cesarean Section - Multiple   Diagnostic Studies History Kerry Pummel, RN; 02/17/2018 7:33 AM) Colonoscopy  within last year Mammogram  within last year Pap Smear  1-5 years ago  Medication History Kerry Pummel, RN; 02/17/2018 7:49 AM) Medications Reconciled  Social History Kerry Pummel, RN; 02/17/2018 7:33 AM) Caffeine use  Tea. No alcohol use  Tobacco use  Never smoker.  Family History Kerry Pummel, RN; 02/17/2018 7:33 AM) Alcohol Abuse  Brother. Breast Cancer  Sister. Cerebrovascular Accident  Father. Colon Cancer  Brother. Colon Polyps  Brother. Depression  Brother. Diabetes Mellitus  Father. Kidney Disease  Brother. Prostate Cancer  Father.  Pregnancy / Birth History Kerry Pummel, RN; 02/17/2018 7:33 AM) Age at menarche  23 years. Contraceptive History  Oral contraceptives. Gravida  2 Maternal age  58-25 Para  2  Other Problems Kerry Pummel, RN; 02/17/2018 7:33 AM) Heart murmur  Hypercholesterolemia     Review of Systems Sunday Spillers Ledford RN; 02/17/2018 7:33 AM) General Present- Fatigue. Not Present- Appetite Loss, Chills, Fever, Night Sweats, Weight Gain and Weight Loss. Skin Not Present- Change in Wart/Mole, Dryness, Hives, Jaundice, New Lesions, Non-Healing Wounds, Rash and Ulcer. HEENT Present-  Wears glasses/contact lenses. Not Present- Earache, Hearing Loss, Hoarseness, Nose Bleed, Oral Ulcers, Ringing in the Ears, Seasonal Allergies, Sinus Pain, Sore Throat, Visual Disturbances and Yellow Eyes. Respiratory Present- Difficulty Breathing and Snoring. Not Present- Bloody  sputum, Chronic Cough and Wheezing. Breast Not Present- Breast Mass, Breast Pain, Nipple Discharge and Skin Changes. Cardiovascular Present- Leg Cramps and Shortness of Breath. Not Present- Chest Pain, Difficulty Breathing Lying Down, Palpitations, Rapid Heart Rate and Swelling of Extremities. Gastrointestinal Not Present- Abdominal Pain, Bloating, Bloody Stool, Change in Bowel Habits, Chronic diarrhea, Constipation, Difficulty Swallowing, Excessive gas, Gets full quickly at meals, Hemorrhoids, Indigestion, Nausea, Rectal Pain and Vomiting. Female Genitourinary Not Present- Frequency, Nocturia, Painful Urination, Pelvic Pain and Urgency. Musculoskeletal Present- Muscle Pain. Not Present- Back Pain, Joint Pain, Joint Stiffness, Muscle Weakness and Swelling of Extremities. Neurological Present- Trouble walking. Not Present- Decreased Memory, Fainting, Headaches, Numbness, Seizures, Tingling, Tremor and Weakness. Psychiatric Present- Change in Sleep Pattern. Not Present- Anxiety, Bipolar, Depression, Fearful and Frequent crying. Endocrine Present- Hair Changes. Not Present- Cold Intolerance, Excessive Hunger, Heat Intolerance, Hot flashes and New Diabetes. Hematology Not Present- Blood Thinners, Easy Bruising, Excessive bleeding, Gland problems, HIV and Persistent Infections.  Physical Exam  General: WN AA F alert and generally healthy appearing. HEENT: Normal. Pupils equal.  Neck: Supple. No mass. No thyroid mass. Lymph Nodes: No supraclavicular or cervical nodes.  Lungs: Clear to auscultation and symmetric breath sounds. Heart: RRR. No murmur or rub.  Breasts: Right - No mass or lesion  Left - I don't feel anything. She had puncture sites in the left breast  Abdomen: Soft. No mass. No tenderness. No hernia. Normal bowel sounds. Rectal: Not done.  Extremities: Good strength and ROM in upper and lower extremities.  Neurologic: Grossly intact to motor and sensory function. Psychiatric: Has  normal mood and affect. Behavior is normal.   Assessment & Plan  1.  MALIGNANT NEOPLASM OF LEFT BREAST, STAGE 1, ESTROGEN RECEPTOR POSITIVE (C50.912)  Story: Left breast biopsy - 02/09/2018 - [id number] - IDC, grade 2, ER - 100%, PR -100%, Ki67 - 2%, Her2Neu - negative  Oncology - [facility]  Plan:  1) Left breast lumpectomy (seed localization) with left axillary SLNBx,   2) Rad tx,  3) Oncotype  Addendum Note(Drexel Ivey H. Lucia Gaskins MD; 03/02/2018 11:19 AM) Her priliminary gene panel is negative.    Alphonsa Overall, MD, [facility] Surgery Pager: [phone] Office phone:  [phone]

## 2018-03-09 NOTE — Progress Notes (Signed)
Emotional support during breast injections °

## 2018-03-10 ENCOUNTER — Encounter (HOSPITAL_BASED_OUTPATIENT_CLINIC_OR_DEPARTMENT_OTHER): Payer: Self-pay | Admitting: Surgery

## 2018-03-17 ENCOUNTER — Inpatient Hospital Stay: Payer: Medicare HMO | Attending: Hematology and Oncology | Admitting: Hematology and Oncology

## 2018-03-17 DIAGNOSIS — C50412 Malignant neoplasm of upper-outer quadrant of left female breast: Secondary | ICD-10-CM | POA: Diagnosis not present

## 2018-03-17 DIAGNOSIS — Z170 Estrogen receptor positive status [ER+]: Secondary | ICD-10-CM | POA: Diagnosis not present

## 2018-03-17 MED ORDER — TIMOLOL MALEATE 0.5 % OP SOLG: 1.0000 [drp] | Freq: Every day | OPHTHALMIC | 12 refills | Status: DC

## 2018-03-17 NOTE — Assessment & Plan Note (Signed)
03/09/2018: Left lumpectomy: IDC grade 1, 0.9 cm, intermediate grade DCIS, no lymphovascular or perineural invasion, 0/1 lymph node negative,  ER 100%, PR 100%, Ki-67 2%, HER-2 negative IHC 1+,T1bN0 stage Ia  Pathology counseling: I discussed the final pathology report of the patient provided  a copy of this report. I discussed the margins as well as lymph node surgeries. We also discussed the final staging along with previously performed ER/PR and HER-2/neu testing.  Recommendation: 1. Oncotype DX testing to determine if chemotherapy would be of any benefit followed by 2. Adjuvant radiation therapy followed by 3. Adjuvant antiestrogen therapy  Return to clinic based upon Oncotype DX test result

## 2018-03-17 NOTE — Progress Notes (Signed)
Patient Care Team: [name], MD as PCP - General (Internal Medicine) Alphonsa Overall, MD as Consulting Physician (General Surgery) Nicholas Lose, MD as Consulting Physician (Hematology and Oncology) Eppie Gibson, MD as Attending Physician (Radiation Oncology)  DIAGNOSIS:  Encounter Diagnosis  Name Primary?  . Malignant neoplasm of upper-outer quadrant of left breast in female, estrogen receptor positive ([place])     SUMMARY OF ONCOLOGIC HISTORY:   Malignant neoplasm of upper-outer quadrant of left breast in female, estrogen receptor positive ([place])   02/09/2018 Initial Diagnosis    Screening detected left breast mass UOQ at 12:30 position 9 mm size, axilla negative, ultrasound biopsy revealed grade 2 IDC ER 100%, PR 100%, Ki-67 2%, HER-2 negative IHC 1+, T1b N0 stage Ia AJCC 8    03/09/2018 Surgery    Left lumpectomy: IDC grade 1, 0.9 cm, intermediate grade DCIS, no lymphovascular or perineural invasion, 0/1 lymph node negative,  ER 100%, PR 100%, Ki-67 2%, HER-2 negative IHC 1+,T1bN0 stage Ia    03/17/2018 Cancer Staging    Staging form: Breast, AJCC 8th Edition - Pathologic: Stage IA (pT1b, pN0, cM0, G1, ER+, PR+, HER2-) - Signed by Nicholas Lose, MD on 03/17/2018     CHIEF COMPLIANT: Follow-up after recent left lumpectomy  INTERVAL HISTORY: Kerry Newman is a 67 year old with above-mentioned history of left breast cancer who underwent left lumpectomy and is here today to discuss the pathology report.  She is recovering and healing very well from recent surgery.  REVIEW OF SYSTEMS:   Constitutional: Denies fevers, chills or abnormal weight loss Eyes: Denies blurriness of vision Ears, nose, mouth, throat, and face: Denies mucositis or sore throat Respiratory: Denies cough, dyspnea or wheezes Cardiovascular: Denies palpitation, chest discomfort Gastrointestinal:  Denies nausea, heartburn or change in bowel habits Skin: Denies abnormal skin rashes Lymphatics: Denies new  lymphadenopathy or easy bruising Neurological:Denies numbness, tingling or new weaknesses Behavioral/Psych: Mood is stable, no new changes  Extremities: No lower extremity edema Breast:  denies any pain or lumps or nodules in either breasts All other systems were reviewed with the patient and are negative.  I have reviewed the past medical history, past surgical history, social history and family history with the patient and they are unchanged from previous note.  ALLERGIES:  has No Known Allergies.  MEDICATIONS:  Current Outpatient Medications  Medication Sig Dispense Refill  . Multiple Vitamin (MULTIVITAMIN WITH MINERALS) TABS Take 1 tablet by mouth daily.    . timolol (TIMOPTIC-XR) 0.5 % ophthalmic gel-forming Place 1 drop into both eyes daily. 5 mL 12   No current facility-administered medications for this visit.     PHYSICAL EXAMINATION: ECOG PERFORMANCE STATUS: 1 - Symptomatic but completely ambulatory  Vitals:   03/17/18 1506  BP: (!) 146/78  Pulse: 60  Resp: 17  Temp: 98.3 F (36.8 C)  SpO2: 100%   Filed Weights   03/17/18 1506  Weight: 152 lb 12.8 oz (69.3 kg)    GENERAL:alert, no distress and comfortable SKIN: skin color, texture, turgor are normal, no rashes or significant lesions EYES: normal, Conjunctiva are pink and non-injected, sclera clear OROPHARYNX:no exudate, no erythema and lips, buccal mucosa, and tongue normal  NECK: supple, thyroid normal size, non-tender, without nodularity LYMPH:  no palpable lymphadenopathy in the cervical, axillary or inguinal LUNGS: clear to auscultation and percussion with normal breathing effort HEART: regular rate & rhythm and no murmurs and no lower extremity edema ABDOMEN:abdomen soft, non-tender and normal bowel sounds MUSCULOSKELETAL:no cyanosis of digits and no  clubbing  NEURO: alert & oriented x 3 with fluent speech, no focal motor/sensory deficits EXTREMITIES: No lower extremity edema   LABORATORY DATA:  I  have reviewed the data as listed CMP Latest Ref Rng & Units 02/17/2018 09/10/2016  Glucose 70 - 99 mg/dL 103(H) 117(H)  BUN 8 - 23 mg/dL 9 13  Creatinine 0.44 - 1.00 mg/dL 0.94 0.76  Sodium 135 - 145 mmol/L 142 139  Potassium 3.5 - 5.1 mmol/L 3.9 3.7  Chloride 98 - 111 mmol/L 105 104  CO2 22 - 32 mmol/L 30 27  Calcium 8.9 - 10.3 mg/dL 9.5 9.0  Total Protein 6.5 - 8.1 g/dL 8.4(H) 8.0  Total Bilirubin 0.3 - 1.2 mg/dL 0.9 1.3(H)  Alkaline Phos 38 - 126 U/L 118 83  AST 15 - 41 U/L 23 24  ALT 0 - 44 U/L 23 22    Lab Results  Component Value Date   WBC 3.0 (L) 02/17/2018   HGB 13.0 02/17/2018   HCT 39.3 02/17/2018   MCV 94.4 02/17/2018   PLT 217 02/17/2018   NEUTROABS 1.5 02/17/2018    ASSESSMENT & PLAN:  Malignant neoplasm of upper-outer quadrant of left breast in female, estrogen receptor positive ([place]) 03/09/2018: Left lumpectomy: IDC grade 1, 0.9 cm, intermediate grade DCIS, no lymphovascular or perineural invasion, 0/1 lymph node negative,  ER 100%, PR 100%, Ki-67 2%, HER-2 negative IHC 1+,T1bN0 stage Ia  Pathology counseling: I discussed the final pathology report of the patient provided  a copy of this report. I discussed the margins as well as lymph node surgeries. We also discussed the final staging along with previously performed ER/PR and HER-2/neu testing.  Recommendation: 1. Oncotype DX testing to determine if chemotherapy would be of any benefit followed by 2. Adjuvant radiation therapy followed by 3. Adjuvant antiestrogen therapy  Return to clinic based upon Oncotype DX test result   No orders of the defined types were placed in this encounter.  The patient has a good understanding of the overall plan. she agrees with it. she will call with any problems that may develop before the next visit here.   Harriette Ohara, MD 03/17/18

## 2018-03-18 ENCOUNTER — Telehealth: Payer: Self-pay | Admitting: *Deleted

## 2018-03-18 NOTE — Telephone Encounter (Signed)
Received order for oncotype testing. Requisition faxed to pathology. Received by Keisha 

## 2018-03-25 ENCOUNTER — Telehealth: Payer: Self-pay | Admitting: *Deleted

## 2018-03-25 ENCOUNTER — Encounter (HOSPITAL_COMMUNITY): Payer: Self-pay | Admitting: Hematology and Oncology

## 2018-03-25 DIAGNOSIS — Z170 Estrogen receptor positive status [ER+]: Principal | ICD-10-CM

## 2018-03-25 DIAGNOSIS — C50412 Malignant neoplasm of upper-outer quadrant of left female breast: Secondary | ICD-10-CM

## 2018-03-25 NOTE — Telephone Encounter (Signed)
Received oncotype score of 7/3%. Physician team notified. Called pt with results and discussed chemotherapy is not recommended. Informed next step will be xrt with Dr. Isidore Moos. Referral place

## 2018-03-30 ENCOUNTER — Encounter: Payer: Self-pay | Admitting: Radiation Oncology

## 2018-03-31 NOTE — Progress Notes (Signed)
Location of Breast Cancer: Left Breast  Histology per Pathology Report:  02/09/18 Diagnosis Breast, left, needle core biopsy, 12:30 o'clock mass - INVASIVE DUCTAL CARCINOMA  Receptor Status: ER(100%), PR (100%), Her2-neu (NEG), Ki-(2%)  03/09/18 Diagnosis 1. Breast, lumpectomy, Left - INVASIVE DUCTAL CARCINOMA, GRADE I, 0.9 CM. - DUCTAL CARCINOMA IN SITU, INTERMEDIATE NUCLEAR GRADE. - CARCINOMA IS LESS THAN 1 MM FROM THE INFERIOR RESECTION EDGE (FINAL INFERIOR MARGIN IS REPRESENTED BY PART 2) WHICH IS NEGATIVE FOR CARCINOMA. - CARCINOMA IS 0.5 CM FROM THE MEDIAL MARGIN. - NEGATIVE FOR LYMPHOVASCULAR OR PERINEURAL INVASION. - SEE ONCOLOGY TABLE. 2. Breast, excision, Left additional Inferior Margin - BENIGN BREAST TISSUE, NEGATIVE FOR CARCINOMA. 3. Lymph node, sentinel, biopsy, Left Axillary - LYMPH NODE NEGATIVE FOR CARCINOMA (0/1).  Did patient present with symptoms or was this found on screening mammography?: It was found on a screening mammogram.   Past/Anticipated interventions by surgeon, if any: 03/09/18 PROCEDURE:   Procedure(s):  LEFT BREAST LUMPECTOMY WITH RADIOACTIVE SEED AND SENTINEL LYMPH NODE BIOPSY, Injection of peri areolar area of breast with methylene blue (1.0 cc), deep sentinel lymph node biopsy SURGEON:   Alphonsa Overall, M.D  Past/Anticipated interventions by medical oncology, if any:  03/17/18 Dr. Lindi Adie Recommendation: 1. Oncotype DX testing to determine if chemotherapy would be of any benefit followed by 2. Adjuvant radiation therapy followed by 3. Adjuvant antiestrogen therapy  Return to clinic based upon Oncotype DX test result (per 03/25/18 note- oncotype result 7/3%, no chemotherapy recommended)  Lymphedema issues, if any:  She does admit to some swelling in her left breast since surgery.   Pain issues, if any:  She has soreness to her left breast. She has taken only Tylenol since her surgery.   SAFETY ISSUES:  Prior radiation? No  Pacemaker/ICD?  No  Possible current pregnancy? No  Is the patient on methotrexate? No  Current Complaints / other details:    BP (!) 152/76 (BP Location: Right Arm)   Pulse 65   Temp 98.4 F (36.9 C) (Oral)   Ht 5' 8.5" (1.74 m)   Wt 149 lb 6.4 oz (67.8 kg)   SpO2 100% Comment: room air  BMI 22.39 kg/m    Wt Readings from Last 3 Encounters:  04/05/18 149 lb 6.4 oz (67.8 kg)  03/17/18 152 lb 12.8 oz (69.3 kg)  03/09/18 152 lb 1.9 oz (69 kg)      Rashonda Warrior, Stephani Police, RN 03/31/2018,8:24 AM

## 2018-04-01 ENCOUNTER — Ambulatory Visit: Payer: Medicare HMO | Admitting: Physical Therapy

## 2018-04-05 ENCOUNTER — Ambulatory Visit
Admission: RE | Admit: 2018-04-05 | Discharge: 2018-04-05 | Disposition: A | Discharge status: Home / Self Care | Payer: Medicare HMO | Source: Ambulatory Visit | Attending: Radiation Oncology | Admitting: Radiation Oncology

## 2018-04-05 ENCOUNTER — Encounter: Payer: Self-pay | Admitting: Radiation Oncology

## 2018-04-05 ENCOUNTER — Other Ambulatory Visit: Payer: Self-pay

## 2018-04-05 VITALS — BP 152/76 | HR 65 | Temp 98.4°F | Ht 68.5 in | Wt 149.4 lb

## 2018-04-05 DIAGNOSIS — Z170 Estrogen receptor positive status [ER+]: Secondary | ICD-10-CM | POA: Insufficient documentation

## 2018-04-05 DIAGNOSIS — C50412 Malignant neoplasm of upper-outer quadrant of left female breast: Secondary | ICD-10-CM

## 2018-04-05 DIAGNOSIS — Z510 Encounter for antineoplastic radiation therapy: Secondary | ICD-10-CM | POA: Diagnosis not present

## 2018-04-05 NOTE — Progress Notes (Signed)
Radiation Oncology         [phone] ________________________________  Name: Kerry Newman MRN: [id number]  Date: 04/05/2018  DOB: 03/01/1951  Follow-Up Visit Note  Outpatient  CC: [name], MD  Nicholas Lose, MD  Diagnosis:      ICD-10-CM   1. Malignant neoplasm of upper-outer quadrant of left breast in female, estrogen receptor positive ([place]) C50.412    Z17.0     Cancer Staging Malignant neoplasm of upper-outer quadrant of left breast in female, estrogen receptor positive ([place]) Staging form: Breast, AJCC 8th Edition - Clinical stage from 02/17/2018: Stage IA (cT1b, cN0, cM0, G2, ER+, PR+, HER2-) - Unsigned - Pathologic: Stage IA (pT1b, pN0, cM0, G1, ER+, PR+, HER2-) - Signed by Nicholas Lose, MD on 03/17/2018   CHIEF COMPLAINT: Here to discuss management of left breast cancer  Narrative:  The patient returns today for follow-up.     Since consultation date, she underwent breast conserving  surgery on date of 03/09/18 revealed: tumor size of 0.9cm; histology of invasive ductal carcinoma; margin status to invasive disease of negative by 86m; margin status to in situ disease of negative by 148m nodal status of 1 SLN negative;  ER status: +; PR status +, Her2 status neg; Grade 1.  Symptomatically, the patient reports: breast swelling since surgery and some soreness  Oncotype score was 7, she will not receive chemotherapy        ALLERGIES:  is allergic to [place] tartrate-timolol[place]  Meds: Current Outpatient Medications  Medication Sig Dispense Refill  . calcium gluconate 500 MG tablet Take 1 tablet by mouth 3 (three) times daily.    . Multiple Vitamin (MULTIVITAMIN WITH MINERALS) TABS Take 1 tablet by mouth daily.    . timolol (TIMOPTIC-XR) 0.5 % ophthalmic gel-forming Place 1 drop into both eyes daily. 5 mL 12  . acetaminophen (TYLENOL) 325 MG tablet Take 650 mg by mouth every 6 (six) hours as needed.     No current facility-administered medications  for this encounter.     Physical Findings:  height is 5' 8.5" (1.74 m) and weight is 149 lb 6.4 oz (67.8 kg). Her oral temperature is 98.4 F (36.9 C). Her blood pressure is 152/76 (abnormal) and her pulse is 65. Her oxygen saturation is 100%. .     General: Alert and oriented, in no acute distress HEENT: Head is normocephalic. Psychiatric: Judgment and insight are intact. Affect is appropriate. Breast exam reveals surgery scars has healed well.  Mild seroma at lumpectomy site on left   Lab Findings: Lab Results  Component Value Date   WBC 3.0 (L) 02/17/2018   HGB 13.0 02/17/2018   HCT 39.3 02/17/2018   MCV 94.4 02/17/2018   PLT 217 02/17/2018      Radiographic Findings: Nm Sentinel Node Inj-no Rpt (breast)  Result Date: 03/09/2018 Sulfur colloid was injected by the nuclear medicine technologist for melanoma sentinel node.   Mm Breast Surgical Specimen  Result Date: 03/09/2018 CLINICAL DATA:  Specimen radiograph status post left breast lumpectomy. EXAM: SPECIMEN RADIOGRAPH OF THE LEFT BREAST COMPARISON:  Previous exam(s). FINDINGS: Status post excision of the left breast. The radioactive seed and biopsy marker clip are present, completely intact, and were marked for pathology. These findings were communicated with the OR at 10:58 a.m. IMPRESSION: Specimen radiograph of the left breast. Electronically Signed   By: MiAmmie Ferrier.D.   On: 03/09/2018 11:00    Impression/Plan: We discussed adjuvant radiotherapy today.  I recommend radiotherapy to  the left breast  in order to  reduce risk of locoregional recurrence by 2/3.  The risks, benefits and side effects of this treatment were discussed in detail.  She understands that radiotherapy is associated with skin irritation and fatigue in the acute setting. Late effects can include cosmetic changes and rare injury to internal organs.   She is enthusiastic about proceeding with treatment. A consent form has been signed and placed in  her chart.  A total of 3 medically necessary complex treatment devices will be fabricated and supervised by me: 2 fields with MLCs for custom blocks to protect heart, and lungs;  and, a Vac-lok. MORE COMPLEX DEVICES MAY BE MADE IN DOSIMETRY FOR FIELD IN FIELD BEAMS FOR DOSE HOMOGENEITY.  I have requested : 3D Simulation which is medically necessary to give adequate dose to at risk tissues while sparing lungs and heart.  I have requested a DVH of the following structures: lungs, heart, left lumpectomy cavity.    The patient will receive 40.05 Gy in 15 fractions to the left breast with 2 fields.  This will be followed by a boost.  Simulation to be done today.  I spent at least 20 minutes  face to face with the patient and more than 50% of that time was spent in counseling and/or coordination of care. _____________________________________   Eppie Gibson, MD

## 2018-04-06 ENCOUNTER — Encounter: Payer: Self-pay | Admitting: Radiation Oncology

## 2018-04-06 NOTE — Progress Notes (Signed)
Radiation Oncology         [phone] ________________________________  Name: Kerry Newman MRN: [id number]  Date: 04/05/2018  DOB: July 22, 1950  SIMULATION AND TREATMENT PLANNING NOTE     Special treatment procedure  Outpatient  DIAGNOSIS:     ICD-10-CM   1. Malignant neoplasm of upper-outer quadrant of left breast in female, estrogen receptor positive ([place]) C50.412    Z17.0     NARRATIVE:  The patient was brought to the [facility].  Identity was confirmed.  All relevant records and images related to the planned course of therapy were reviewed.  The patient freely provided informed written consent to proceed with treatment after reviewing the details related to the planned course of therapy. The consent form was witnessed and verified by the simulation staff.    Then, the patient was set-up in a stable reproducible supine position for radiation therapy with her ipsilateral arm over her head, and her upper body secured in a custom-made Vac-lok device.  CT images were obtained.  Surface markings were placed.  The CT images were loaded into the planning software.    Special treatment procedure:  Special treatment procedure was performed today due to the extra time and effort required by myself to plan and prepare this patient for deep inspiration breath hold technique.  I have determined cardiac sparing to be of benefit to this patient to prevent long term cardiac damage due to radiation of the heart.  Bellows were placed on the patient's abdomen. To facilitate cardiac sparing, the patient was coached by the radiation therapists on breath hold techniques and breathing practice was performed. Practice waveforms were obtained. The patient was then scanned while maintaining breath hold in the treatment position.  This image was then transferred over to the imaging specialist. The imaging specialist then created a fusion of the free breathing and breath hold scans using the chest  wall as the stable structure. I personally reviewed the fusion in axial, coronal and sagittal image planes.  Excellent cardiac sparing was obtained.  I felt the patient is an appropriate candidate for breath hold and the patient will be treated as such.  The image fusion was then reviewed with the patient to reinforce the necessity of reproducible breath hold.  TREATMENT PLANNING NOTE: Treatment planning then occurred.  The radiation prescription was entered and confirmed.     A total of 3 medically necessary complex treatment devices were fabricated and supervised by me: 2 fields with MLCs for custom blocks to protect heart, and lungs;  and, a Vac-lok. MORE COMPLEX DEVICES MAY BE MADE IN DOSIMETRY FOR FIELD IN FIELD BEAMS FOR DOSE HOMOGENEITY.  I have requested : 3D Simulation which is medically necessary to give adequate dose to at risk tissues while sparing lungs and heart.  I have requested a DVH of the following structures: lungs, heart, left lumpectomy cavity.    The patient will receive 40.05 Gy in 15 fractions to the left breast with 2 tangential fields.   This will be followed by a boost.  Optical Surface Tracking Plan:  Since intensity modulated radiotherapy (IMRT) and 3D conformal radiation treatment methods are predicated on accurate and precise positioning for treatment, intrafraction motion monitoring is medically necessary to ensure accurate and safe treatment delivery. The ability to quantify intrafraction motion without excessive ionizing radiation dose can only be performed with optical surface tracking. Accordingly, surface imaging offers the opportunity to obtain 3D measurements of patient position throughout IMRT and 3D  treatments without excessive radiation exposure. I am ordering optical surface tracking for this patient's upcoming course of radiotherapy.  ________________________________   Reference:  Ursula Alert, J, et al. Surface imaging-based analysis of  intrafraction motion for breast radiotherapy patients.Journal of [place], n. 6, nov. 2014. ISSN [id number].  Available at: <http://www.jacmp.org/index.php/jacmp/article/view/4957>.    -----------------------------------  Eppie Gibson, MD

## 2018-04-07 ENCOUNTER — Telehealth: Payer: Self-pay | Admitting: Hematology and Oncology

## 2018-04-07 DIAGNOSIS — Z510 Encounter for antineoplastic radiation therapy: Secondary | ICD-10-CM | POA: Diagnosis not present

## 2018-04-07 NOTE — Telephone Encounter (Signed)
Appt scheduled patient notified LMVM with date/time per 10/16 sch msg

## 2018-04-12 ENCOUNTER — Ambulatory Visit: Payer: Medicare HMO

## 2018-04-12 ENCOUNTER — Ambulatory Visit: Payer: Medicare HMO | Admitting: Radiation Oncology

## 2018-04-13 ENCOUNTER — Ambulatory Visit: Payer: Medicare HMO

## 2018-04-13 ENCOUNTER — Ambulatory Visit
Admission: RE | Admit: 2018-04-13 | Discharge: 2018-04-13 | Disposition: A | Discharge status: Home / Self Care | Payer: Medicare HMO | Source: Ambulatory Visit | Attending: Radiation Oncology | Admitting: Radiation Oncology

## 2018-04-13 DIAGNOSIS — Z510 Encounter for antineoplastic radiation therapy: Secondary | ICD-10-CM | POA: Diagnosis not present

## 2018-04-14 ENCOUNTER — Ambulatory Visit
Admission: RE | Admit: 2018-04-14 | Discharge: 2018-04-14 | Disposition: A | Discharge status: Home / Self Care | Payer: Medicare HMO | Source: Ambulatory Visit | Attending: Radiation Oncology | Admitting: Radiation Oncology

## 2018-04-14 ENCOUNTER — Ambulatory Visit: Payer: Medicare HMO

## 2018-04-14 DIAGNOSIS — Z510 Encounter for antineoplastic radiation therapy: Secondary | ICD-10-CM | POA: Diagnosis not present

## 2018-04-15 ENCOUNTER — Ambulatory Visit
Admission: RE | Admit: 2018-04-15 | Discharge: 2018-04-15 | Disposition: A | Discharge status: Home / Self Care | Payer: Medicare HMO | Source: Ambulatory Visit | Attending: Radiation Oncology | Admitting: Radiation Oncology

## 2018-04-15 ENCOUNTER — Ambulatory Visit: Payer: Medicare HMO

## 2018-04-15 DIAGNOSIS — Z510 Encounter for antineoplastic radiation therapy: Secondary | ICD-10-CM | POA: Diagnosis not present

## 2018-04-16 ENCOUNTER — Ambulatory Visit: Payer: Medicare HMO

## 2018-04-16 ENCOUNTER — Ambulatory Visit
Admission: RE | Admit: 2018-04-16 | Discharge: 2018-04-16 | Disposition: A | Discharge status: Home / Self Care | Payer: Medicare HMO | Source: Ambulatory Visit | Attending: Radiation Oncology | Admitting: Radiation Oncology

## 2018-04-16 DIAGNOSIS — Z510 Encounter for antineoplastic radiation therapy: Secondary | ICD-10-CM | POA: Diagnosis not present

## 2018-04-19 ENCOUNTER — Ambulatory Visit: Payer: Medicare HMO

## 2018-04-19 ENCOUNTER — Ambulatory Visit
Admission: RE | Admit: 2018-04-19 | Discharge: 2018-04-19 | Disposition: A | Discharge status: Home / Self Care | Payer: Medicare HMO | Source: Ambulatory Visit | Attending: Radiation Oncology | Admitting: Radiation Oncology

## 2018-04-19 DIAGNOSIS — C50412 Malignant neoplasm of upper-outer quadrant of left female breast: Secondary | ICD-10-CM

## 2018-04-19 DIAGNOSIS — Z170 Estrogen receptor positive status [ER+]: Principal | ICD-10-CM

## 2018-04-19 DIAGNOSIS — Z510 Encounter for antineoplastic radiation therapy: Secondary | ICD-10-CM | POA: Diagnosis not present

## 2018-04-19 MED ORDER — ALRA NON-METALLIC DEODORANT (RAD-ONC)
1.0000 "application " | Freq: Once | TOPICAL | Status: AC
  Administered 2018-04-19: 1 via TOPICAL

## 2018-04-19 MED ORDER — RADIAPLEXRX EX GEL
Freq: Once | CUTANEOUS | Status: AC
  Administered 2018-04-19: 15:00:00 via TOPICAL

## 2018-04-19 NOTE — Progress Notes (Signed)

## 2018-04-20 ENCOUNTER — Ambulatory Visit
Admission: RE | Admit: 2018-04-20 | Discharge: 2018-04-20 | Disposition: A | Discharge status: Home / Self Care | Payer: Medicare HMO | Source: Ambulatory Visit | Attending: Radiation Oncology | Admitting: Radiation Oncology

## 2018-04-20 ENCOUNTER — Ambulatory Visit: Payer: Medicare HMO

## 2018-04-20 DIAGNOSIS — Z510 Encounter for antineoplastic radiation therapy: Secondary | ICD-10-CM | POA: Diagnosis not present

## 2018-04-21 ENCOUNTER — Ambulatory Visit
Admission: RE | Admit: 2018-04-21 | Discharge: 2018-04-21 | Disposition: A | Discharge status: Home / Self Care | Payer: Medicare HMO | Source: Ambulatory Visit | Attending: Radiation Oncology | Admitting: Radiation Oncology

## 2018-04-21 ENCOUNTER — Ambulatory Visit: Payer: Medicare HMO

## 2018-04-21 DIAGNOSIS — Z510 Encounter for antineoplastic radiation therapy: Secondary | ICD-10-CM | POA: Diagnosis not present

## 2018-04-22 ENCOUNTER — Ambulatory Visit: Payer: Medicare HMO

## 2018-04-22 ENCOUNTER — Ambulatory Visit
Admission: RE | Admit: 2018-04-22 | Discharge: 2018-04-22 | Disposition: A | Discharge status: Home / Self Care | Payer: Medicare HMO | Source: Ambulatory Visit | Attending: Radiation Oncology | Admitting: Radiation Oncology

## 2018-04-22 DIAGNOSIS — Z510 Encounter for antineoplastic radiation therapy: Secondary | ICD-10-CM | POA: Diagnosis not present

## 2018-04-23 ENCOUNTER — Ambulatory Visit
Admission: RE | Admit: 2018-04-23 | Discharge: 2018-04-23 | Disposition: A | Discharge status: Home / Self Care | Payer: Medicare HMO | Source: Ambulatory Visit | Attending: Radiation Oncology | Admitting: Radiation Oncology

## 2018-04-23 ENCOUNTER — Ambulatory Visit: Payer: Medicare HMO

## 2018-04-23 DIAGNOSIS — C50412 Malignant neoplasm of upper-outer quadrant of left female breast: Secondary | ICD-10-CM | POA: Diagnosis not present

## 2018-04-23 DIAGNOSIS — Z510 Encounter for antineoplastic radiation therapy: Secondary | ICD-10-CM | POA: Diagnosis present

## 2018-04-23 DIAGNOSIS — Z170 Estrogen receptor positive status [ER+]: Secondary | ICD-10-CM | POA: Insufficient documentation

## 2018-04-23 IMAGING — CT CT CERVICAL SPINE W/O CM
4 of 7 series · 14 of 33 positions shown, 15 images · non-contrast
Comparison: None.

CLINICAL DATA: Syncopal episode.  Fell.  Hit head.

EXAM:
CT HEAD WITHOUT CONTRAST
CT CERVICAL SPINE WITHOUT CONTRAST
TECHNIQUE: Multidetector CT imaging of the head and cervical spine was
performed following the standard protocol without intravenous
contrast. Multiplanar CT image reconstructions of the cervical spine
were also generated.

[Series 4: head 3.0 mpr cor · coronal · 0.31mm/px · 2 of 68 slices shown]
[im 23/68  bone]
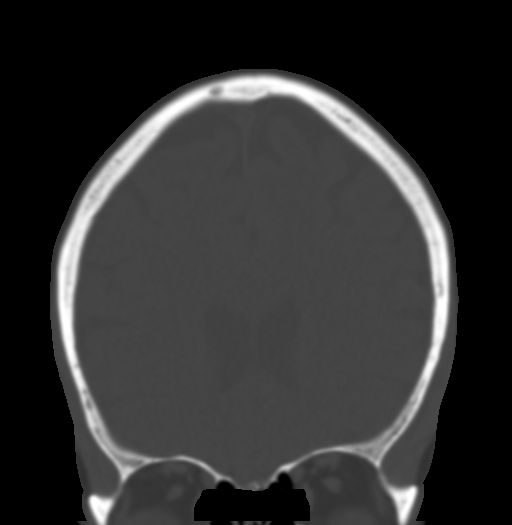
[im 45/68  bone]
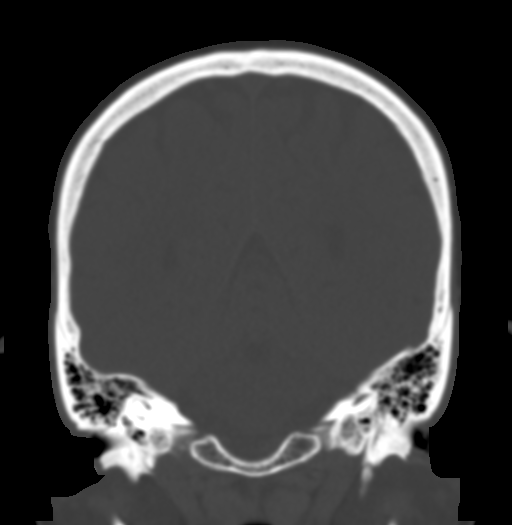

[Series 7: c_spine 2.0 i30s 3 · axial · 0.43mm/px · z∈[-261,-185]mm · 3 of 76 slices shown]
[im 19/76  bone]
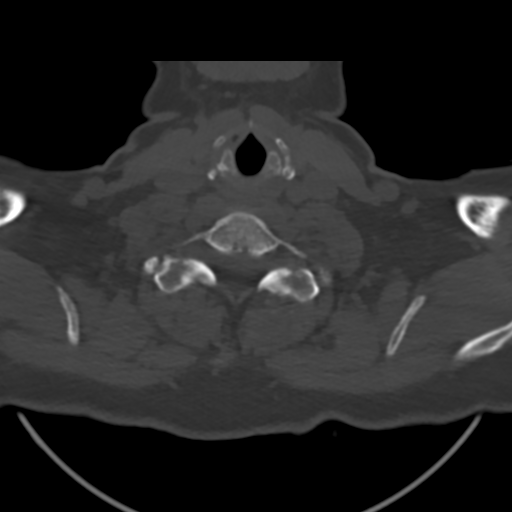
[im 38/76  bone]
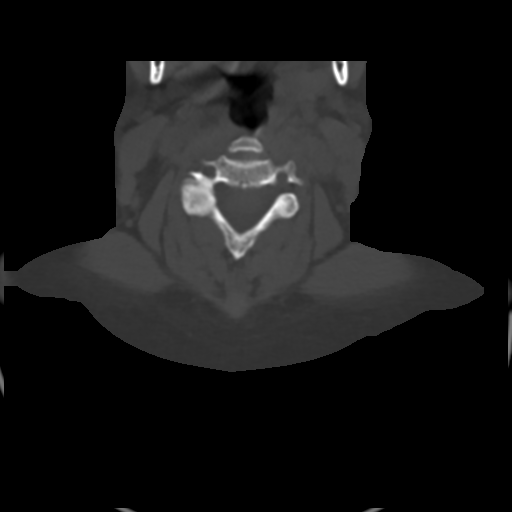
[im 57/76  bone]
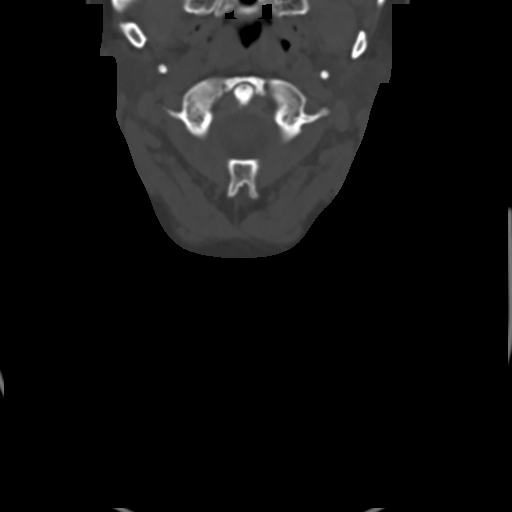

[Series 10: sagittals · sagittal · 0.25mm/px · 5 of 66 slices shown]
[im 11/66  bone]
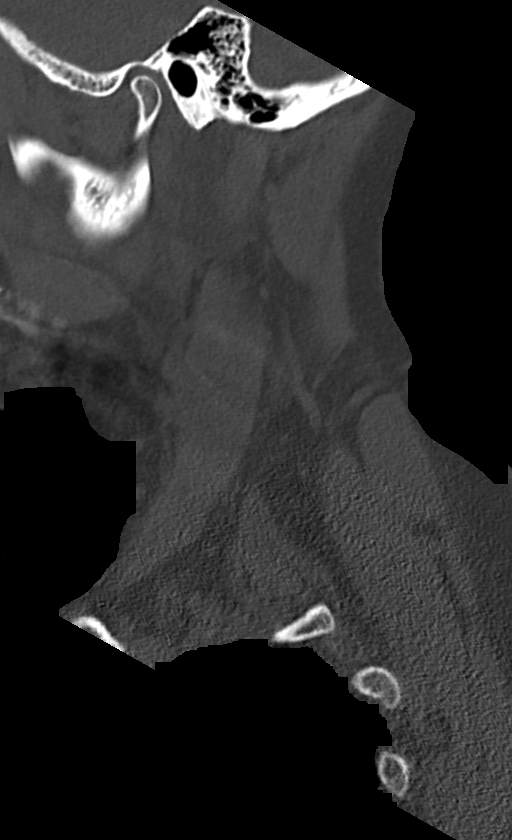
[im 22/66  bone]
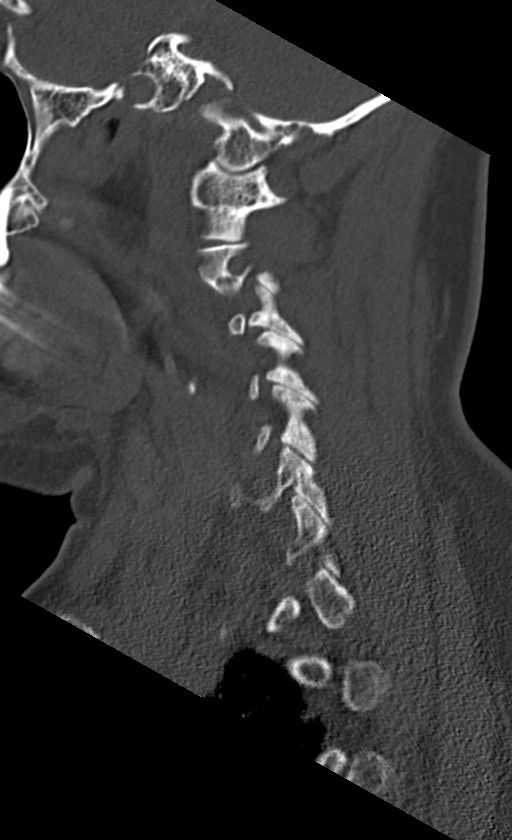
[im 33/66  bone]
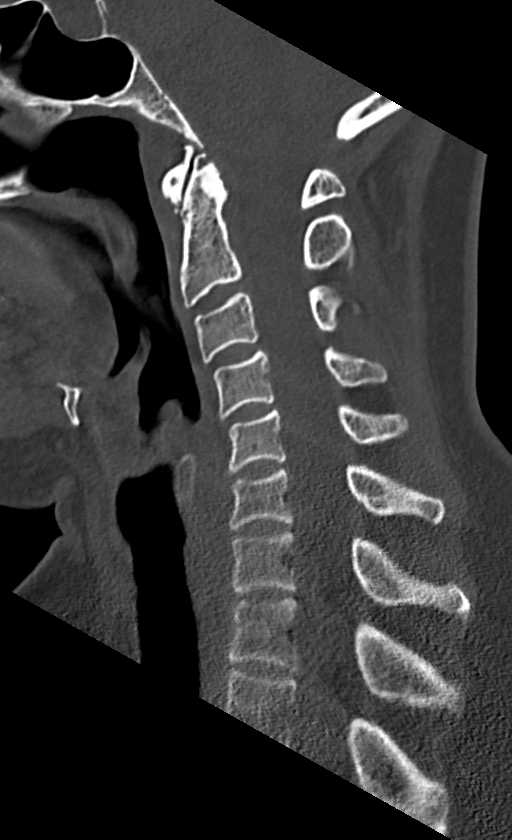
[im 44/66  bone]
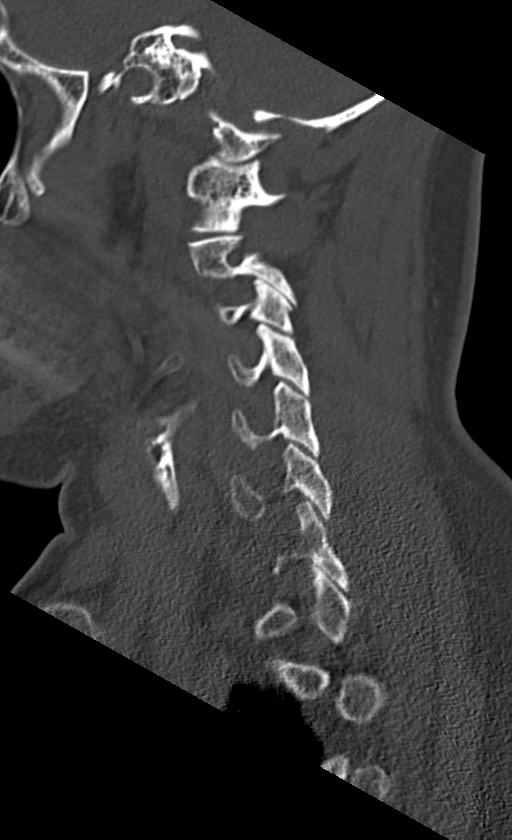
[im 55/66  bone]
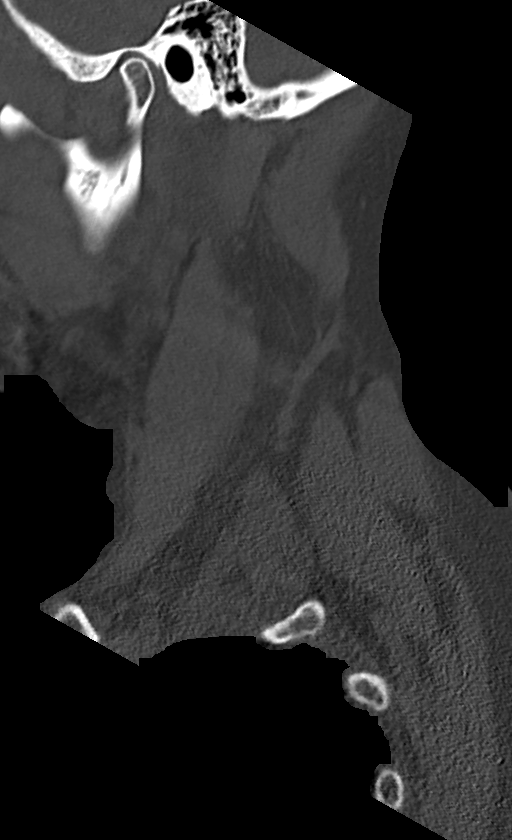

[Series 11: orthogonals · axial · 0.32mm/px · z∈[-330,-234]mm · 4 of 96 slices shown, 5 images]
[im 20/96  soft-tissue]
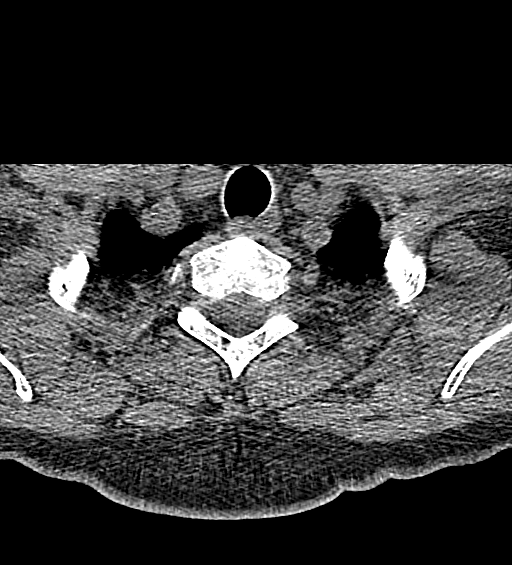
[im 20/96  bone]
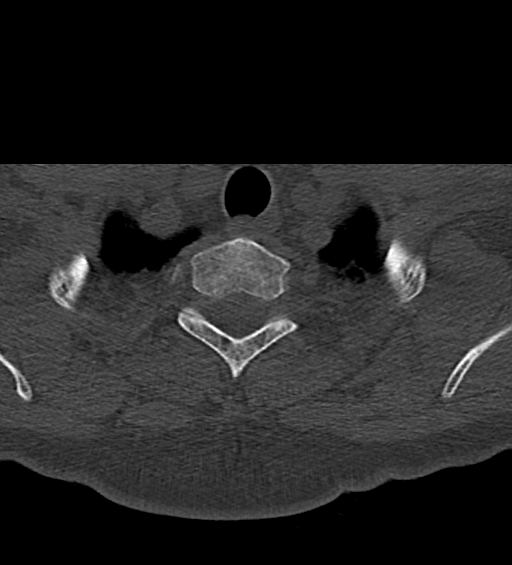
[im 39/96  bone]
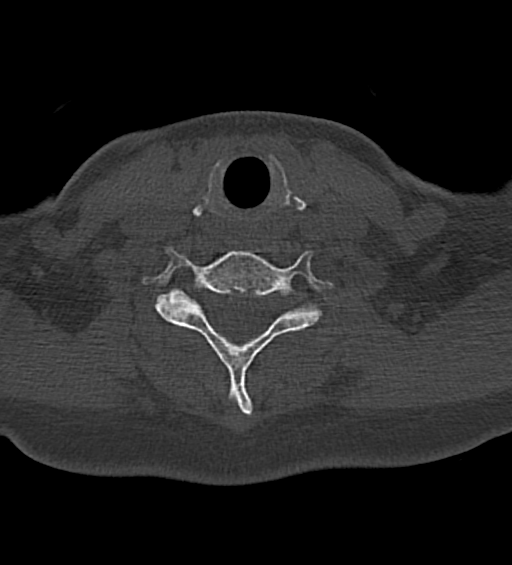
[im 58/96  bone]
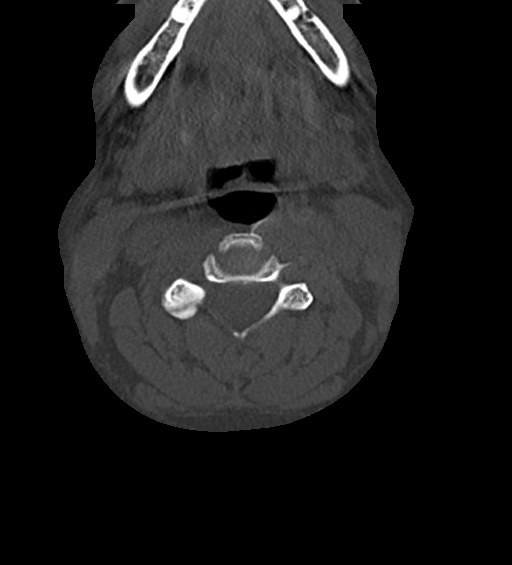
[im 77/96  bone]
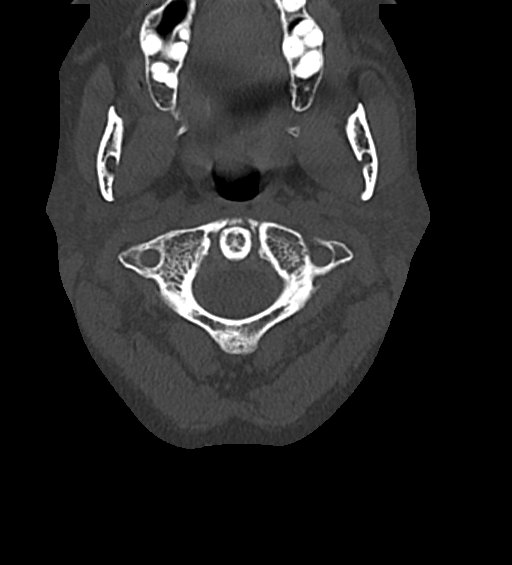

[14 of 33 positions shown; findings below may reference images not displayed]

FINDINGS: CT HEAD FINDINGS

Brain:

The ventricles are normal in size and configuration. No extra-axial
fluid collections are identified. The gray-white differentiation is
normal. No CT findings for acute intracranial process such as
hemorrhage or infarction. No mass lesions. The brainstem and
cerebellum are grossly normal.

Vascular: No hyperdense vessels, obvious aneurysm or significant
vascular calcifications.

Skull: No skull fracture.

Sinuses/Orbits: The paranasal sinuses and mastoid air cells are
clear. The globes are intact.

Other: No scalp lesion or hematoma.

CT CERVICAL SPINE FINDINGS

Alignment: Mild reversal of the normal cervical lordosis which could
be due to positioning, muscle spasm or pain. The alignment is
normal. The facets are normally aligned.

Skull base and vertebrae: No acute fractures identified. The
skullbase C1 and C1-2 articulations are maintained. Moderate
degenerative changes at C1-2.

Soft tissues and spinal canal: No prevertebral fluid or swelling. No
visible canal hematoma. The spinal canal is generous.

Disc levels:  No disc protrusions, spinal or foraminal stenosis.

Upper chest: Apical scarring changes but no worrisome pulmonary
lesions.

Other: No neck mass or lymphadenopathy.
IMPRESSION: 1. No acute intracranial findings or skull fracture.
2. No acute cervical spine fracture.

## 2018-04-23 IMAGING — CT CT ABD-PELV W/ CM
2 of 5 series · 16 of 46 positions shown, 18 images · IV contrast (APPLIED)
Comparison: None.

CLINICAL DATA: Fall after getting up to use the bathroom last
night. Diarrhea with blood in stool.

EXAM:
CT ABDOMEN AND PELVIS WITH CONTRAST
TECHNIQUE: Multidetector CT imaging of the abdomen and pelvis was performed
using the standard protocol following bolus administration of
intravenous contrast.
CONTRAST:  100mL QTCXEO-NZZ IOPAMIDOL (QTCXEO-NZZ) INJECTION 61%

[Series 2: axial st · axial · 0.82mm/px · z∈[-411,-31]mm · 13 of 86 slices shown, 15 images]
[im 5/86  soft-tissue]
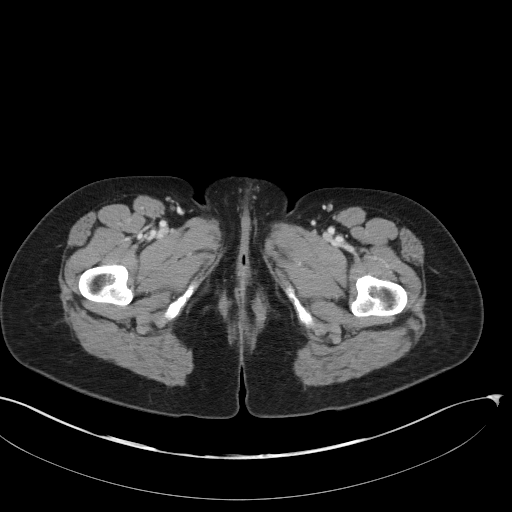
[im 5/86  bone]
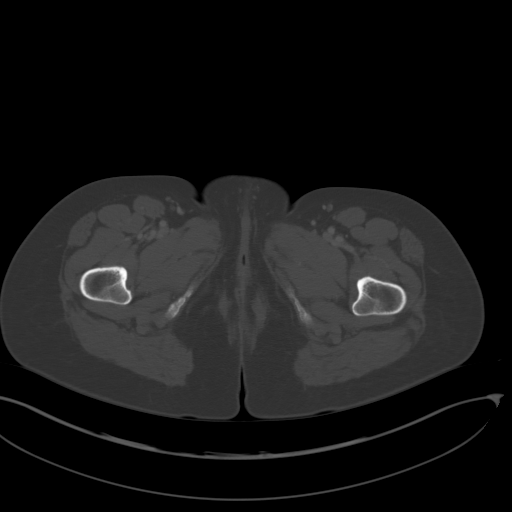
[im 14/86  soft-tissue]
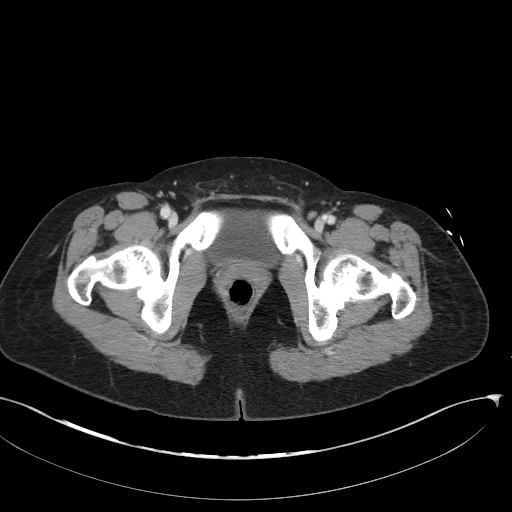
[im 18/86  soft-tissue]
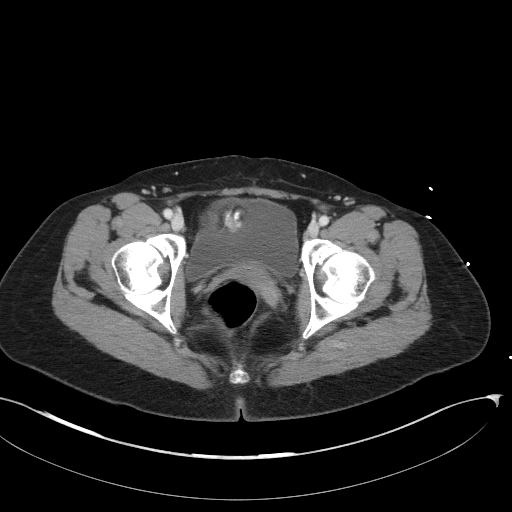
[im 23/86  soft-tissue]
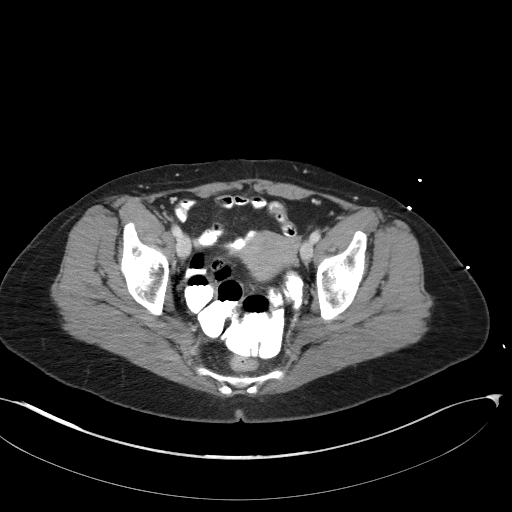
[im 32/86  soft-tissue]
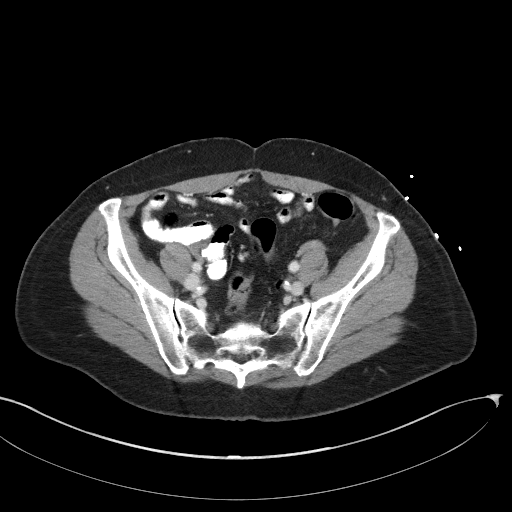
[im 36/86  soft-tissue]
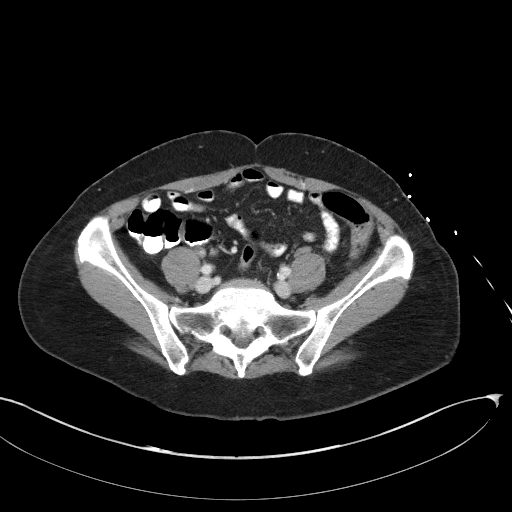
[im 45/86  soft-tissue]
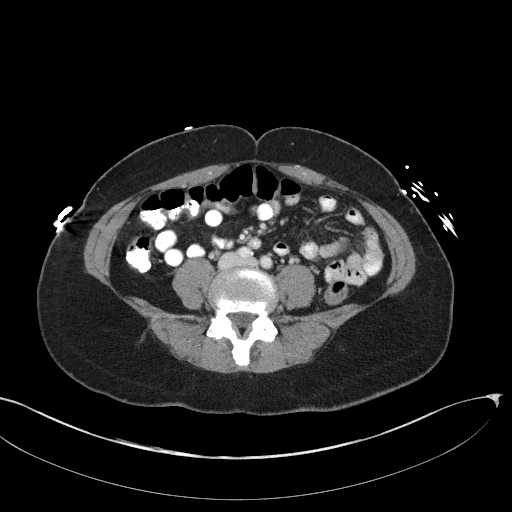
[im 50/86  soft-tissue]
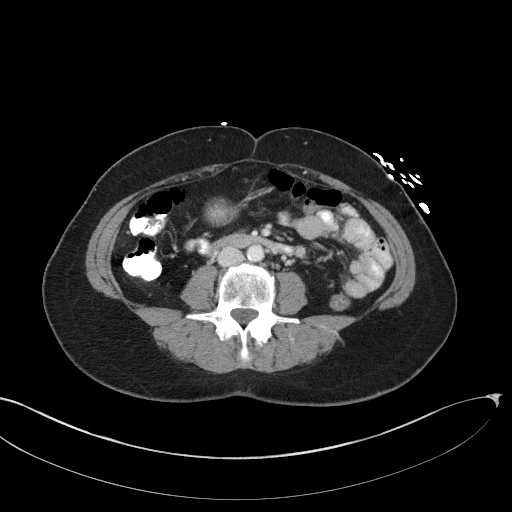
[im 54/86  soft-tissue]
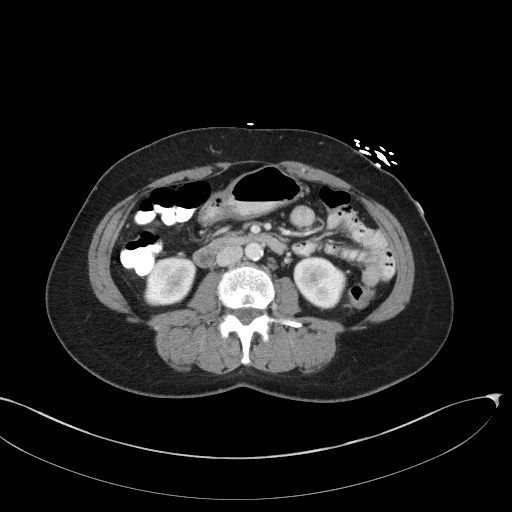
[im 54/86  bone]
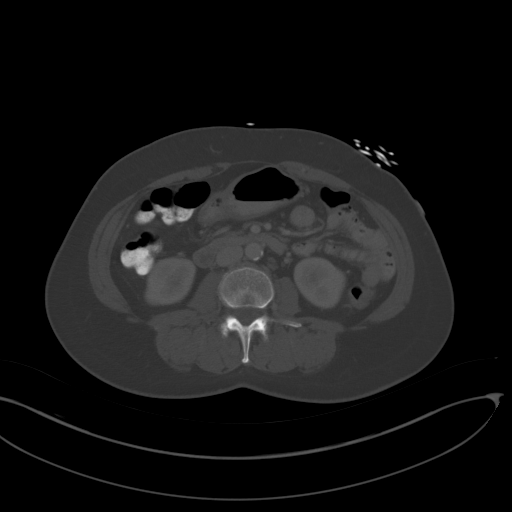
[im 63/86  soft-tissue]
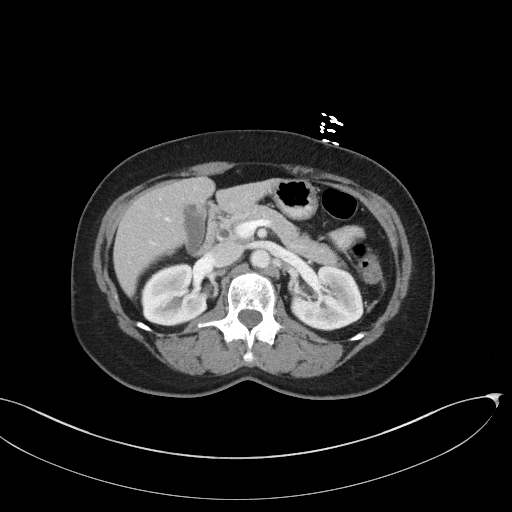
[im 68/86  soft-tissue]
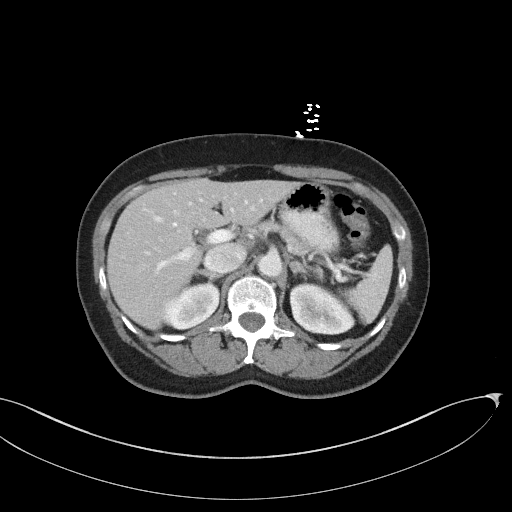
[im 72/86  soft-tissue]
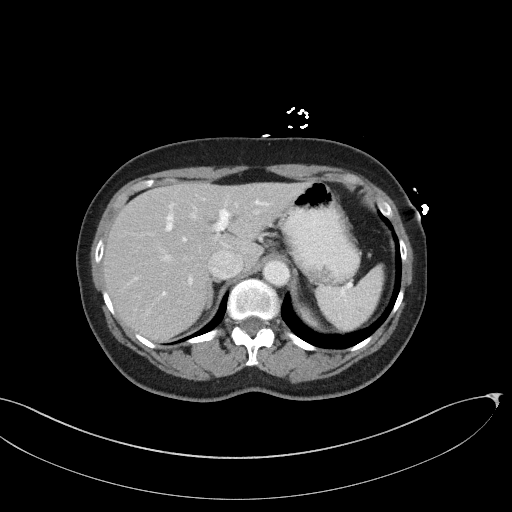
[im 81/86  soft-tissue]
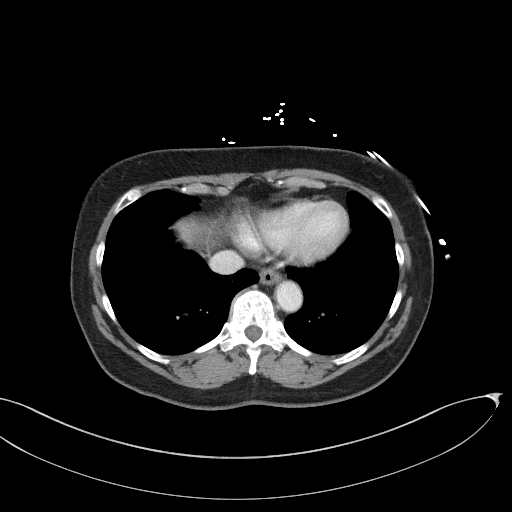

[Series 5: coronal st · coronal · 0.72mm/px · 3 of 85 slices shown]
[im 29/85  soft-tissue]
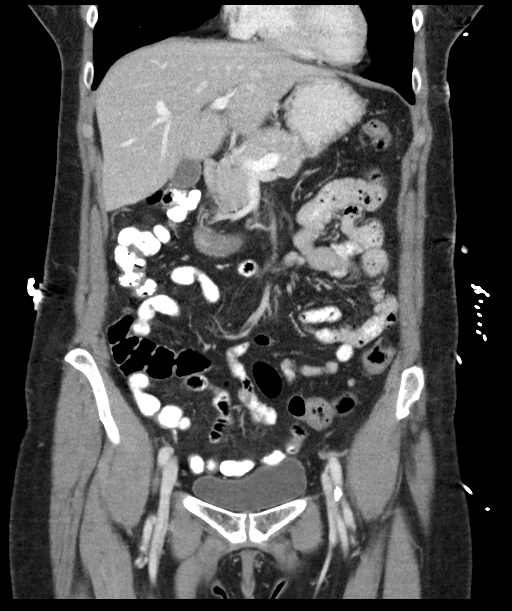
[im 38/85  soft-tissue]
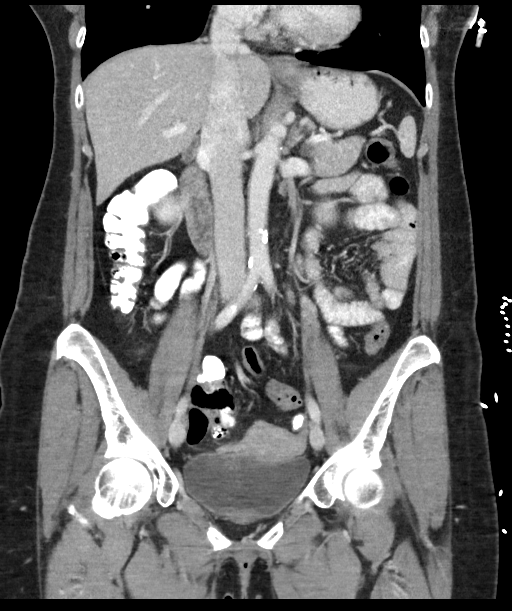
[im 47/85  soft-tissue]
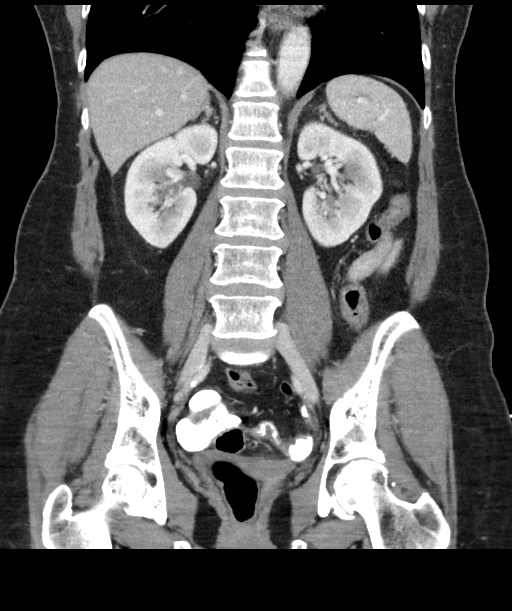

[16 of 46 positions shown; findings below may reference images not displayed]

FINDINGS: Lower chest: The lung bases are clear without focal nodule, mass, or
airspace disease the heart size is normal. There is no pleural or
pericardial is present.

Hepatobiliary: A benign-appearing cyst within segment VIII of the
liver measures 10 mm in transverse diameter, not significantly
changed. Two other smaller lesions have not significantly changed.
The common bile duct and gallbladder are normal.

Pancreas: Unremarkable. No pancreatic ductal dilatation or
surrounding inflammatory changes.

Spleen: Normal in size without focal abnormality.

Adrenals/Urinary Tract: The adrenal glands are normal bilaterally.
The kidneys and ureters are within normal limits. The urinary
bladder is unremarkable.

Stomach/Bowel: The stomach and duodenum are within normal limits.
The small bowel is unremarkable. The appendix is visualized and
normal. The cecum extends into the anatomic pelvis. The ascending
and transverse colon are within normal limits. The descending and
sigmoid colon are unremarkable.

Vascular/Lymphatic: Aortic atherosclerosis. No enlarged abdominal or
pelvic lymph nodes.

Reproductive: Uterus and adnexa are within normal limits for age.

Other: No abdominal wall hernia or abnormality. No abdominopelvic
ascites.

Musculoskeletal: Bone windows are unremarkable. No focal lytic or
blastic lesions are present. That bony pelvis is intact. The hips
are located bilaterally.
IMPRESSION: 1. No acute focal lesion to explain the patient's symptoms.
2. Stable hepatic cysts.

## 2018-04-26 ENCOUNTER — Ambulatory Visit
Admission: RE | Admit: 2018-04-26 | Discharge: 2018-04-26 | Disposition: A | Discharge status: Home / Self Care | Payer: Medicare HMO | Source: Ambulatory Visit | Attending: Radiation Oncology | Admitting: Radiation Oncology

## 2018-04-26 ENCOUNTER — Ambulatory Visit: Payer: Medicare HMO

## 2018-04-26 DIAGNOSIS — Z510 Encounter for antineoplastic radiation therapy: Secondary | ICD-10-CM | POA: Diagnosis not present

## 2018-04-27 ENCOUNTER — Ambulatory Visit: Payer: Medicare HMO

## 2018-04-27 ENCOUNTER — Ambulatory Visit
Admission: RE | Admit: 2018-04-27 | Discharge: 2018-04-27 | Disposition: A | Discharge status: Home / Self Care | Payer: Medicare HMO | Source: Ambulatory Visit | Attending: Radiation Oncology | Admitting: Radiation Oncology

## 2018-04-27 DIAGNOSIS — Z510 Encounter for antineoplastic radiation therapy: Secondary | ICD-10-CM | POA: Diagnosis not present

## 2018-04-28 ENCOUNTER — Ambulatory Visit
Admission: RE | Admit: 2018-04-28 | Discharge: 2018-04-28 | Disposition: A | Discharge status: Home / Self Care | Payer: Medicare HMO | Source: Ambulatory Visit | Attending: Radiation Oncology | Admitting: Radiation Oncology

## 2018-04-28 ENCOUNTER — Ambulatory Visit: Payer: Medicare HMO

## 2018-04-28 DIAGNOSIS — Z510 Encounter for antineoplastic radiation therapy: Secondary | ICD-10-CM | POA: Diagnosis not present

## 2018-04-29 ENCOUNTER — Ambulatory Visit
Admission: RE | Admit: 2018-04-29 | Discharge: 2018-04-29 | Disposition: A | Discharge status: Home / Self Care | Payer: Medicare HMO | Source: Ambulatory Visit | Attending: Radiation Oncology | Admitting: Radiation Oncology

## 2018-04-29 ENCOUNTER — Ambulatory Visit: Payer: Medicare HMO

## 2018-04-29 DIAGNOSIS — Z510 Encounter for antineoplastic radiation therapy: Secondary | ICD-10-CM | POA: Diagnosis not present

## 2018-04-30 ENCOUNTER — Ambulatory Visit: Payer: Medicare HMO

## 2018-04-30 ENCOUNTER — Ambulatory Visit
Admission: RE | Admit: 2018-04-30 | Discharge: 2018-04-30 | Disposition: A | Discharge status: Home / Self Care | Payer: Medicare HMO | Source: Ambulatory Visit | Attending: Radiation Oncology | Admitting: Radiation Oncology

## 2018-04-30 DIAGNOSIS — Z510 Encounter for antineoplastic radiation therapy: Secondary | ICD-10-CM | POA: Diagnosis not present

## 2018-05-03 ENCOUNTER — Ambulatory Visit: Payer: Medicare HMO

## 2018-05-03 ENCOUNTER — Ambulatory Visit
Admission: RE | Admit: 2018-05-03 | Discharge: 2018-05-03 | Disposition: A | Discharge status: Home / Self Care | Payer: Medicare HMO | Source: Ambulatory Visit | Attending: Radiation Oncology | Admitting: Radiation Oncology

## 2018-05-03 DIAGNOSIS — Z510 Encounter for antineoplastic radiation therapy: Secondary | ICD-10-CM | POA: Diagnosis not present

## 2018-05-04 ENCOUNTER — Inpatient Hospital Stay: Payer: Medicare HMO | Attending: Hematology and Oncology | Admitting: Hematology and Oncology

## 2018-05-04 ENCOUNTER — Ambulatory Visit
Admission: RE | Admit: 2018-05-04 | Discharge: 2018-05-04 | Disposition: A | Discharge status: Home / Self Care | Payer: Medicare HMO | Source: Ambulatory Visit | Attending: Radiation Oncology | Admitting: Radiation Oncology

## 2018-05-04 ENCOUNTER — Ambulatory Visit: Payer: Medicare HMO

## 2018-05-04 DIAGNOSIS — C50412 Malignant neoplasm of upper-outer quadrant of left female breast: Secondary | ICD-10-CM | POA: Diagnosis present

## 2018-05-04 DIAGNOSIS — Z723 Lack of physical exercise: Secondary | ICD-10-CM | POA: Diagnosis not present

## 2018-05-04 DIAGNOSIS — Z923 Personal history of irradiation: Secondary | ICD-10-CM | POA: Insufficient documentation

## 2018-05-04 DIAGNOSIS — Z510 Encounter for antineoplastic radiation therapy: Secondary | ICD-10-CM | POA: Diagnosis not present

## 2018-05-04 DIAGNOSIS — Z79811 Long term (current) use of aromatase inhibitors: Secondary | ICD-10-CM | POA: Insufficient documentation

## 2018-05-04 DIAGNOSIS — Z170 Estrogen receptor positive status [ER+]: Secondary | ICD-10-CM

## 2018-05-04 MED ORDER — LETROZOLE 2.5 MG PO TABS: 2.5000 mg | ORAL_TABLET | Freq: Every day | ORAL | 3 refills | Status: DC

## 2018-05-04 NOTE — Assessment & Plan Note (Signed)
03/09/2018: Left lumpectomy: IDC grade 1, 0.9 cm, intermediate grade DCIS, no lymphovascular or perineural invasion, 0/1 lymph node negative,  ER 100%, PR 100%, Ki-67 2%, HER-2 negative IHC 1+,T1bN0 stage Ia Oncotype DX recurrence score 7: 3% RRR at 9 years Adjuvant radiation therapy to be completed 05/05/2018  Treatment plan: Adjuvant antiestrogen therapy with letrozole 2.5 mg daily  Letrozole counseling:We discussed the risks and benefits of anti-estrogen therapy with aromatase inhibitors. These include but not limited to insomnia, hot flashes, mood changes, vaginal dryness, bone density loss, and weight gain. We strongly believe that the benefits far outweigh the risks. Patient understands these risks and consented to starting treatment. Planned treatment duration is 5 years.   Return to clinic in a few months for survivorship care plan visit

## 2018-05-04 NOTE — Progress Notes (Signed)
Patient Care Team: [name], MD as PCP - General (Internal Medicine) Alphonsa Overall, MD as Consulting Physician (General Surgery) Nicholas Lose, MD as Consulting Physician (Hematology and Oncology) Eppie Gibson, MD as Attending Physician (Radiation Oncology)  DIAGNOSIS:  Encounter Diagnosis  Name Primary?  . Malignant neoplasm of upper-outer quadrant of left breast in female, estrogen receptor positive ([place])     SUMMARY OF ONCOLOGIC HISTORY:   Malignant neoplasm of upper-outer quadrant of left breast in female, estrogen receptor positive ([place])   02/09/2018 Initial Diagnosis    Screening detected left breast mass UOQ at 12:30 position 9 mm size, axilla negative, ultrasound biopsy revealed grade 2 IDC ER 100%, PR 100%, Ki-67 2%, HER-2 negative IHC 1+, T1b N0 stage Ia AJCC 8    03/09/2018 Surgery    Left lumpectomy: IDC grade 1, 0.9 cm, intermediate grade DCIS, no lymphovascular or perineural invasion, 0/1 lymph node negative,  ER 100%, PR 100%, Ki-67 2%, HER-2 negative IHC 1+,T1bN0 stage Ia    03/17/2018 Cancer Staging    Staging form: Breast, AJCC 8th Edition - Pathologic: Stage IA (pT1b, pN0, cM0, G1, ER+, PR+, HER2-) - Signed by Nicholas Lose, MD on 03/17/2018     CHIEF COMPLIANT: Follow-up at the end of radiation  INTERVAL HISTORY: Kerry Newman is a 67 year old with above-mentioned history of left breast cancer treated with lumpectomy and is here end of radiation to discuss antiestrogen therapy.  She is tolerating radiation extremely well.  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  REVIEW OF SYSTEMS:   Constitutional: Denies fevers, chills or abnormal weight loss Eyes: Denies blurriness of vision Ears, nose, mouth, throat, and face: Denies mucositis or sore throat Respiratory: Denies cough, dyspnea or wheezes Cardiovascular: Denies palpitation, chest discomfort Gastrointestinal:  Denies nausea, heartburn or change in bowel habits Skin: Radiation  dermatitis Lymphatics: Denies new lymphadenopathy or easy bruising Neurological:Denies numbness, tingling or new weaknesses Behavioral/Psych: Mood is stable, no new changes  Extremities: No lower extremity edema Breast: This denies any pain or lumps or nodules in either breasts All other systems were reviewed with the patient and are negative.  I have reviewed the past medical history, past surgical history, social history and family history with the patient and they are unchanged from previous note.  ALLERGIES:  is allergic to [place] [brimonidine tartrate-timolol].  MEDICATIONS:  Current Outpatient Medications  Medication Sig Dispense Refill  . acetaminophen (TYLENOL) 325 MG tablet Take 650 mg by mouth every 6 (six) hours as needed.    . calcium gluconate 500 MG tablet Take 1 tablet by mouth 3 (three) times daily.    Marland Kitchen letrozole (FEMARA) 2.5 MG tablet Take 1 tablet (2.5 mg total) by mouth daily. 90 tablet 3  . Multiple Vitamin (MULTIVITAMIN WITH MINERALS) TABS Take 1 tablet by mouth daily.    . timolol (TIMOPTIC-XR) 0.5 % ophthalmic gel-forming Place 1 drop into both eyes daily. 5 mL 12   No current facility-administered medications for this visit.     PHYSICAL EXAMINATION: ECOG PERFORMANCE STATUS: 1 - Symptomatic but completely ambulatory  Vitals:   05/04/18 1435  BP: (!) 141/63  Pulse: 98  Resp: 17  Temp: 100 F (37.8 C)   Filed Weights   05/04/18 1435  Weight: 156 lb 1.6 oz (70.8 kg)    GENERAL:alert, no distress and comfortable SKIN: skin color, texture, turgor are normal, no rashes or significant lesions EYES: normal, Conjunctiva are pink and non-injected, sclera clear OROPHARYNX:no exudate, no erythema and lips,  buccal mucosa, and tongue normal  NECK: supple, thyroid normal size, non-tender, without nodularity LYMPH:  no palpable lymphadenopathy in the cervical, axillary or inguinal LUNGS: clear to auscultation and percussion with normal breathing effort HEART:  regular rate & rhythm and no murmurs and no lower extremity edema ABDOMEN:abdomen soft, non-tender and normal bowel sounds MUSCULOSKELETAL:no cyanosis of digits and no clubbing  NEURO: alert & oriented x 3 with fluent speech, no focal motor/sensory deficits EXTREMITIES: No lower extremity edema BREAST: No palpable masses or nodules in either right or left breasts. No palpable axillary supraclavicular or infraclavicular adenopathy no breast tenderness or nipple discharge. (exam performed in the presence of a chaperone)  LABORATORY DATA:  I have reviewed the data as listed CMP Latest Ref Rng & Units 02/17/2018 09/10/2016  Glucose 70 - 99 mg/dL 103(H) 117(H)  BUN 8 - 23 mg/dL 9 13  Creatinine 0.44 - 1.00 mg/dL 0.94 0.76  Sodium 135 - 145 mmol/L 142 139  Potassium 3.5 - 5.1 mmol/L 3.9 3.7  Chloride 98 - 111 mmol/L 105 104  CO2 22 - 32 mmol/L 30 27  Calcium 8.9 - 10.3 mg/dL 9.5 9.0  Total Protein 6.5 - 8.1 g/dL 8.4(H) 8.0  Total Bilirubin 0.3 - 1.2 mg/dL 0.9 1.3(H)  Alkaline Phos 38 - 126 U/L 118 83  AST 15 - 41 U/L 23 24  ALT 0 - 44 U/L 23 22    Lab Results  Component Value Date   WBC 3.0 (L) 02/17/2018   HGB 13.0 02/17/2018   HCT 39.3 02/17/2018   MCV 94.4 02/17/2018   PLT 217 02/17/2018   NEUTROABS 1.5 02/17/2018    ASSESSMENT & PLAN:  Malignant neoplasm of upper-outer quadrant of left breast in female, estrogen receptor positive ([place]) 03/09/2018: Left lumpectomy: IDC grade 1, 0.9 cm, intermediate grade DCIS, no lymphovascular or perineural invasion, 0/1 lymph node negative,  ER 100%, PR 100%, Ki-67 2%, HER-2 negative IHC 1+,T1bN0 stage Ia Oncotype DX recurrence score 7: 3% RRR at 9 years Adjuvant radiation therapy to be completed 05/05/2018  Treatment plan: Adjuvant antiestrogen therapy with letrozole 2.5 mg daily  Letrozole counseling:We discussed the risks and benefits of anti-estrogen therapy with aromatase inhibitors. These include but not limited to insomnia, hot flashes,  mood changes, vaginal dryness, bone density loss, and weight gain. We strongly believe that the benefits far outweigh the risks. Patient understands these risks and consented to starting treatment. Planned treatment duration is 5 years.   Return to clinic in a few months for survivorship care plan visit     No orders of the defined types were placed in this encounter.  The patient has a good understanding of the overall plan. she agrees with it. she will call with any problems that may develop before the next visit here.   Harriette Ohara, MD 05/04/18

## 2018-05-05 ENCOUNTER — Ambulatory Visit: Payer: Medicare HMO

## 2018-05-05 ENCOUNTER — Ambulatory Visit
Admission: RE | Admit: 2018-05-05 | Discharge: 2018-05-05 | Disposition: A | Discharge status: Home / Self Care | Payer: Medicare HMO | Source: Ambulatory Visit | Attending: Radiation Oncology | Admitting: Radiation Oncology

## 2018-05-05 DIAGNOSIS — Z510 Encounter for antineoplastic radiation therapy: Secondary | ICD-10-CM | POA: Diagnosis not present

## 2018-05-06 ENCOUNTER — Ambulatory Visit: Payer: Medicare HMO

## 2018-05-06 ENCOUNTER — Ambulatory Visit
Admission: RE | Admit: 2018-05-06 | Discharge: 2018-05-06 | Disposition: A | Discharge status: Home / Self Care | Payer: Medicare HMO | Source: Ambulatory Visit | Attending: Radiation Oncology | Admitting: Radiation Oncology

## 2018-05-06 DIAGNOSIS — Z510 Encounter for antineoplastic radiation therapy: Secondary | ICD-10-CM | POA: Diagnosis not present

## 2018-05-07 ENCOUNTER — Ambulatory Visit
Admission: RE | Admit: 2018-05-07 | Discharge: 2018-05-07 | Disposition: A | Discharge status: Home / Self Care | Payer: Medicare HMO | Source: Ambulatory Visit | Attending: Radiation Oncology | Admitting: Radiation Oncology

## 2018-05-07 ENCOUNTER — Ambulatory Visit: Payer: Medicare HMO

## 2018-05-07 DIAGNOSIS — Z510 Encounter for antineoplastic radiation therapy: Secondary | ICD-10-CM | POA: Diagnosis not present

## 2018-05-10 ENCOUNTER — Ambulatory Visit: Payer: Medicare HMO

## 2018-05-10 ENCOUNTER — Encounter: Payer: Self-pay | Admitting: Radiation Oncology

## 2018-05-10 ENCOUNTER — Ambulatory Visit
Admission: RE | Admit: 2018-05-10 | Discharge: 2018-05-10 | Disposition: A | Discharge status: Home / Self Care | Payer: Medicare HMO | Source: Ambulatory Visit | Attending: Radiation Oncology | Admitting: Radiation Oncology

## 2018-05-10 DIAGNOSIS — Z510 Encounter for antineoplastic radiation therapy: Secondary | ICD-10-CM | POA: Diagnosis not present

## 2018-05-11 NOTE — Progress Notes (Signed)
  Radiation Oncology         [phone] ________________________________  Name: Kerry Newman MRN: [id number]  Date: 05/10/2018  DOB: 01/13/1951  End of Treatment Note  Diagnosis:   67 y.o. female with Malignant neoplasm of upper-outer quadrant of left breast in female, estrogen receptor positive ([place]) Staging form: Breast, AJCC 8th Edition - Clinical stage from 02/17/2018: Stage IA (cT1b, cN0, cM0, G2, ER+, PR+, HER2-) - Unsigned - Pathologic: Stage IA (pT1b, pN0, cM0, G1, ER+, PR+, HER2-) - Signed by Nicholas Lose, MD on 03/17/2018     Indication for treatment:  Curative       Radiation treatment dates:   04/13/2018 - 05/10/2018  Site/dose:    1. Left Breast / 40.05 Gy in 15 fractions 2. Left Breast Boost / 10 Gy in 5 fractions  Beams/energy:    1. 3D / 6X, 10X Photon 2. 15E Electron  Narrative: The patient tolerated radiation treatment relatively well.  She experienced very mild fatigue and some expected skin changes as she progressed through treatment, notably erythema and hyperpigmentation over the left breast. She is applying Radiaplex to her skin as ordered.  Plan: The patient has completed radiation treatment. The patient will return to radiation oncology clinic for routine followup in one month. I advised them to call or return sooner if they have any questions or concerns related to their recovery or treatment.  -----------------------------------  Eppie Gibson, MD  This document serves as a record of services personally performed by Eppie Gibson, MD. It was created on her behalf by Rae Lips, a trained medical scribe. The creation of this record is based on the scribe's personal observations and the provider's statements to them. This document has been checked and approved by the attending provider.

## 2018-06-07 ENCOUNTER — Encounter: Payer: Self-pay | Admitting: Radiation Oncology

## 2018-06-07 NOTE — Progress Notes (Signed)
Kerry Newman presents for follow up of radiation completed 05/10/18 to her left breast. She saw Dr. Lindi Adie on 05/04/18 and was started on Letrozole 2.5 mg daily. She will see survivorship on 08/09/18. Her Left Breast remains hyperpigmented. She does have visible darkened areas over her left breast and incision site. She has completed radiaplex and is using Eucerin cream to her radiation site. She will begin using a vitamin E containing lotion or oil to her radiation site.   BP (!) 150/90 (BP Location: Right Arm, Patient Position: Sitting)   Pulse 67   Temp (!) 97.5 F (36.4 C) (Oral)   Resp 18   Ht 5\' 9"  (1.753 m)   Wt 158 lb 6 oz (71.8 kg)   SpO2 100%   BMI 23.39 kg/m    Wt Readings from Last 3 Encounters:  06/09/18 158 lb 6 oz (71.8 kg)  05/04/18 156 lb 1.6 oz (70.8 kg)  04/05/18 149 lb 6.4 oz (67.8 kg)

## 2018-06-09 ENCOUNTER — Ambulatory Visit
Admission: RE | Admit: 2018-06-09 | Discharge: 2018-06-09 | Disposition: A | Discharge status: Home / Self Care | Payer: Medicare HMO | Source: Ambulatory Visit | Attending: Radiation Oncology | Admitting: Radiation Oncology

## 2018-06-09 ENCOUNTER — Encounter: Payer: Self-pay | Admitting: Radiation Oncology

## 2018-06-09 ENCOUNTER — Other Ambulatory Visit: Payer: Self-pay

## 2018-06-09 DIAGNOSIS — Z170 Estrogen receptor positive status [ER+]: Secondary | ICD-10-CM | POA: Diagnosis not present

## 2018-06-09 DIAGNOSIS — Z79811 Long term (current) use of aromatase inhibitors: Secondary | ICD-10-CM | POA: Insufficient documentation

## 2018-06-09 DIAGNOSIS — C50412 Malignant neoplasm of upper-outer quadrant of left female breast: Secondary | ICD-10-CM | POA: Insufficient documentation

## 2018-06-09 HISTORY — DX: Personal history of irradiation: Z92.3

## 2018-06-09 NOTE — Progress Notes (Signed)
Radiation Oncology         [phone] ________________________________  Name: Kerry Newman MRN: [id number]  Date: 06/09/2018  DOB: 01/22/1951  Follow-Up Visit Note  Outpatient  CC: Kerry Ebbs, MD  Kerry Lose, MD  Diagnosis and Prior Radiotherapy:    ICD-10-CM   1. Malignant neoplasm of upper-outer quadrant of left breast in female, estrogen receptor positive ([place]) C50.412    Z17.0     Malignant neoplasm of upper-outer quadrant of left breast in female, estrogen receptor positive ([place]) Staging form: Breast, AJCC 8th Edition - Clinical stage from 02/17/2018: Stage IA (cT1b, cN0, cM0, G2, ER+, PR+, HER2-) - Unsigned - Pathologic: Stage IA (pT1b, pN0, cM0, G1, ER+, PR+, HER2-) - Signed by Kerry Lose, MD on 03/17/2018     Radiation treatment dates:   04/13/2018 - 05/10/2018 Site/dose:    1. Left Breast / 40.05 Gy in 15 fractions 2. Left Breast Boost / 10 Gy in 5 fractions  CHIEF COMPLAINT: Here for follow-up and surveillance of left breast cancer  Narrative:  The patient returns today for routine follow-up.  She last saw Dr. Lindi Newman on 05/04/18 and was started on letrozole 2.5 mg daily. She will see Survivorship on 08/09/18. She reports darkened areas over her left breast and incision site. She has completed the Radiaplex and is using Eucerin cream to the radiation site.       ALLERGIES:  is allergic to [place] [brimonidine tartrate-timolol].  Meds: Current Outpatient Medications  Medication Sig Dispense Refill  . acetaminophen (TYLENOL) 325 MG tablet Take 650 mg by mouth every 6 (six) hours as needed.    . calcium gluconate 500 MG tablet Take 1 tablet by mouth 3 (three) times daily.    Marland Kitchen letrozole (FEMARA) 2.5 MG tablet Take 1 tablet (2.5 mg total) by mouth daily. 90 tablet 3  . Multiple Vitamin (MULTIVITAMIN WITH MINERALS) TABS Take 1 tablet by mouth daily.    . timolol (TIMOPTIC-XR) 0.5 % ophthalmic gel-forming Place 1 drop into both eyes daily. 5 mL 12   No  current facility-administered medications for this encounter.     Physical Findings: The patient is in no acute distress. Patient is alert and oriented.  height is '5\' 9"'$  (1.753 m) and weight is 158 lb 6 oz (71.8 kg). Her oral temperature is 97.5 F (36.4 C) (abnormal). Her blood pressure is 150/90 (abnormal) and her pulse is 67. Her respiration is 18 and oxygen saturation is 100%. .    Satisfactory skin healing in radiotherapy fields. Residual darkening most pronounced within hair follicles of left breast. Skin intact  Lab Findings: Lab Results  Component Value Date   WBC 3.0 (L) 02/17/2018   HGB 13.0 02/17/2018   HCT 39.3 02/17/2018   MCV 94.4 02/17/2018   PLT 217 02/17/2018    Radiographic Findings: No results found.  Impression/Plan: Healing well from radiotherapy to the breast tissue.  Continue skin care with topical Vitamin E lotion for at least 2 more months for further healing.  I encouraged her to continue with yearly mammography and followup with medical oncology. I will see her back on an as-needed basis. I have encouraged her to call if she has any issues or concerns in the future. I wished her the very best.  _____________________________________   Kerry Gibson, MD  This document serves as a record of services personally performed by Kerry Gibson, MD. It was created on her behalf by Kerry Newman, a trained medical scribe. The creation of this  record is based on the scribe's personal observations and the provider's statements to them. This document has been checked and approved by the attending provider.

## 2018-06-24 ENCOUNTER — Telehealth: Payer: Self-pay

## 2018-06-24 NOTE — Telephone Encounter (Signed)
Spoke with Dr Sondra Come in regards to pt flying. Per Dr Sondra Come advised pt to wear a lymphedema sleeve and she should be ok to fly. Spoke with pt and advised. Pt verbalized understanding. Also advised pt that she can get a lymphedema sleeve from a medical supplier. Advised if she had any issues to please call back.

## 2018-06-25 ENCOUNTER — Telehealth: Payer: Self-pay | Admitting: *Deleted

## 2018-06-25 NOTE — Telephone Encounter (Signed)
RETURNED PATIENT'S PHONE CALL, SPOKE WITH PATIENT. ?

## 2018-08-04 ENCOUNTER — Telehealth: Payer: Self-pay

## 2018-08-04 NOTE — Telephone Encounter (Signed)
LVM for patient reminding of SCP visit with NP on 08/09/18 at 2 pm.  Center number left for call back with questions.

## 2018-08-09 ENCOUNTER — Inpatient Hospital Stay: Payer: Medicare HMO | Attending: Hematology and Oncology | Admitting: Adult Health

## 2018-08-09 ENCOUNTER — Encounter: Payer: Self-pay | Admitting: Adult Health

## 2018-08-09 VITALS — BP 150/86 | HR 74 | Temp 98.8°F | Resp 18 | Ht 69.0 in | Wt 159.6 lb

## 2018-08-09 DIAGNOSIS — Z923 Personal history of irradiation: Secondary | ICD-10-CM | POA: Insufficient documentation

## 2018-08-09 DIAGNOSIS — Z170 Estrogen receptor positive status [ER+]: Secondary | ICD-10-CM | POA: Diagnosis not present

## 2018-08-09 DIAGNOSIS — Z79811 Long term (current) use of aromatase inhibitors: Secondary | ICD-10-CM | POA: Insufficient documentation

## 2018-08-09 DIAGNOSIS — Z79810 Long term (current) use of selective estrogen receptor modulators (SERMs): Secondary | ICD-10-CM | POA: Diagnosis not present

## 2018-08-09 DIAGNOSIS — M8580 Other specified disorders of bone density and structure, unspecified site: Secondary | ICD-10-CM | POA: Diagnosis not present

## 2018-08-09 DIAGNOSIS — C50412 Malignant neoplasm of upper-outer quadrant of left female breast: Secondary | ICD-10-CM | POA: Diagnosis present

## 2018-08-09 NOTE — Progress Notes (Signed)
CLINIC:  Survivorship   REASON FOR VISIT:  Routine follow-up post-treatment for a recent history of breast cancer.  BRIEF ONCOLOGIC HISTORY:    Malignant neoplasm of upper-outer quadrant of left breast in female, estrogen receptor positive ([place])   02/09/2018 Initial Diagnosis    Screening detected left breast mass UOQ at 12:30 position 9 mm size, axilla negative, ultrasound biopsy revealed grade 2 IDC ER 100%, PR 100%, Ki-67 2%, HER-2 negative IHC 1+, T1b N0 stage Ia AJCC 8    02/18/2018 Genetic Testing    Genetic testing was negative and didn't detect any deleterious mutations.  Variant of Uncertain Significance: MUTYH c.1508G>A (p.Gly503Gl), SDHA c.1919A>G (p.Glu640Gly),SMAD4 c.521C>A (p.Thr174Asn), TERT c.403G>A (p.Gly135Arg). Genes tested include: AIP, ALK, APC, ATM,  AXIN2, BAP1, BARD1, BLM, BMPR1A, BRCA1, BRCA2, BRIP1, CASR, CDC73, CDH1, CDK4, CDKN1B, CDKN1C, CDKN2A (p14ARF), CDKN2A (p16INK4a), CEBPA, CHEK2, CTNNA1, DICER1, DIS3L2, EGFR, EPCAM*, FH , FLCN, GATA2, GPC3, GREM1*, HOXB13, HRAS, KIT, MAX, MEN1, MET, MITF, MLH1, MSH2, MSH3, MSH6, MUTYH, NBN, NF1, NF2, NTHL1, PALB2, PDGFRA, PHOX2B*, PMS2, POLD1, POLE, POT1, PRKAR1A, PTCH1, PTEN, RAD50, RAD51C, RAD51D, RB1, RECQL4, RET, RUNX1, SDHA*, SDHAF2, SDHB, SDHC, SDHD, SMAD4, SMARCA4, SMARCB1,SMARCE1, STK11, SUFU, TERC, TERT, TMEM127,  TP53, TSC1, TSC2, VHL, WRN*, WT1     03/09/2018 Surgery    Left lumpectomy: IDC grade 1, 0.9 cm, intermediate grade DCIS, no lymphovascular or perineural invasion, 0/1 lymph node negative,  ER 100%, PR 100%, Ki-67 2%, HER-2 negative IHC 1+,T1bN0 stage Ia    03/09/2018 Oncotype testing    7/3%    03/17/2018 Cancer Staging    Staging form: Breast, AJCC 8th Edition - Pathologic: Stage IA (pT1b, pN0, cM0, G1, ER+, PR+, HER2-) - Signed by Nicholas Lose, MD on 03/17/2018    04/13/2018 - 05/10/2018 Radiation Therapy    Radiation with Isidore Moos 1. Left Breast / 40.05 Gy in 15 fractions 2. Left Breast Boost / 10  Gy in 5 fractions    05/2018 -  Anti-estrogen oral therapy    Letrozole daily     INTERVAL HISTORY:  Kerry Newman presents to the [facility] today for our initial meeting to review her survivorship care plan detailing her treatment course for breast cancer, as well as monitoring long-term side effects of that treatment, education regarding health maintenance, screening, and overall wellness and health promotion.     Overall, Kerry Newman reports feeling quite well.  She is taking Letrozole daily and is tolerating it moderately well.  She notes mild fatigue, some hair thinning.  She notes her eyes are dry and she is following up with her eye doctor soon about changing her drops for her glaucoma.  She has some mild pain in her left axilla.  She denies vaginal dryness, hot flashes or arthralgias.      REVIEW OF SYSTEMS:  Review of Systems  Constitutional: Positive for fatigue. Negative for appetite change, chills, fever and unexpected weight change.  HENT:   Negative for hearing loss, lump/mass, sore throat and trouble swallowing.   Eyes: Positive for eye problems (as per interval history).  Respiratory: Negative for chest tightness, cough and shortness of breath.   Cardiovascular: Negative for chest pain, leg swelling and palpitations.  Gastrointestinal: Negative for abdominal distention, abdominal pain, constipation, diarrhea, nausea and vomiting.  Endocrine: Negative for hot flashes.  Genitourinary: Negative for difficulty urinating.   Musculoskeletal: Negative for arthralgias.  Skin: Negative for itching and rash.  Neurological: Negative for dizziness, headaches and numbness.  Hematological: Negative for adenopathy. Does not bruise/bleed  easily.  Psychiatric/Behavioral: Negative for depression. The patient is not nervous/anxious.   Breast: Denies any new nodularity, masses, tenderness, nipple changes, or nipple discharge.      ONCOLOGY TREATMENT TEAM:  1. Surgeon:  Dr. Lucia Gaskins  at [facility] 2. Medical Oncologist: Dr. Lindi Adie  3. Radiation Oncologist: Dr. Isidore Moos    PAST MEDICAL/SURGICAL HISTORY:  Past Medical History:  Diagnosis Date  . Family history of breast cancer   . Family history of colon cancer   . Family history of kidney cancer   . Family history of prostate cancer   . History of radiation therapy 04/13/18- 05/10/18   Left Breast 40.05 Gy in 15 fractions, Left breast boost 10 gy in 5 fractions  . Malignant neoplasm of upper-outer quadrant of left breast in female, estrogen receptor positive ([place]) 02/12/2018   Past Surgical History:  Procedure Laterality Date  . BREAST EXCISIONAL BIOPSY Left   . BREAST LUMPECTOMY WITH RADIOACTIVE SEED AND SENTINEL LYMPH NODE BIOPSY Left 03/09/2018   Procedure: LEFT BREAST LUMPECTOMY WITH RADIOACTIVE SEED AND SENTINEL LYMPH NODE BIOPSY;  Surgeon: Alphonsa Overall, MD;  Location: [facility];  Service: General;  Laterality: Left;  . CESAREAN SECTION       ALLERGIES:  Allergies  Allergen Reactions  . [place] [Brimonidine Tartrate-Timolol] Other (See Comments)    Broke face out in rash     CURRENT MEDICATIONS:  Outpatient Encounter Medications as of 08/09/2018  Medication Sig  . acetaminophen (TYLENOL) 325 MG tablet Take 650 mg by mouth every 6 (six) hours as needed.  . calcium gluconate 500 MG tablet Take 1 tablet by mouth 3 (three) times daily.  Marland Kitchen letrozole (FEMARA) 2.5 MG tablet Take 1 tablet (2.5 mg total) by mouth daily.  . Multiple Vitamin (MULTIVITAMIN WITH MINERALS) TABS Take 1 tablet by mouth daily.  . [DISCONTINUED] timolol (TIMOPTIC-XR) 0.5 % ophthalmic gel-forming Place 1 drop into both eyes daily. (Patient not taking: Reported on 08/09/2018)   No facility-administered encounter medications on file as of 08/09/2018.      ONCOLOGIC FAMILY HISTORY:  Family History  Problem Relation Age of Onset  . Breast cancer Sister   . Prostate cancer Father   . Liver cancer Brother     . Kidney cancer Brother   . Stomach cancer Sister      GENETIC COUNSELING/TESTING:negative  SOCIAL HISTORY:  Social History   Socioeconomic History  . Marital status: Married    Spouse name: Not on file  . Number of children: Not on file  . Years of education: Not on file  . Highest education level: Not on file  Occupational History  . Not on file  Social Needs  . Financial resource strain: Not on file  . Food insecurity:    Worry: Not on file    Inability: Not on file  . Transportation needs:    Medical: No    Non-medical: No  Tobacco Use  . Smoking status: Never Smoker  . Smokeless tobacco: Never Used  Substance and Sexual Activity  . Alcohol use: No  . Drug use: No  . Sexual activity: Not on file  Lifestyle  . Physical activity:    Days per week: Not on file    Minutes per session: Not on file  . Stress: Not on file  Relationships  . Social connections:    Talks on phone: Not on file    Gets together: Not on file    Attends religious service: Not on file  Active member of club or organization: Not on file    Attends meetings of clubs or organizations: Not on file    Relationship status: Not on file  . Intimate partner violence:    Fear of current or ex partner: No    Emotionally abused: No    Physically abused: No    Forced sexual activity: No  Other Topics Concern  . Not on file  Social History Narrative  . Not on file     PHYSICAL EXAMINATION:  Vital Signs:   Vitals:   08/09/18 1418  BP: (!) 150/86  Pulse: 74  Resp: 18  Temp: 98.8 F (37.1 C)  SpO2: 100%   Filed Weights   08/09/18 1418  Weight: 159 lb 9.6 oz (72.4 kg)   General: Well-nourished, well-appearing female in no acute distress.  She is unaccompanied today.   HEENT: Head is normocephalic.  Pupils equal and reactive to light. Conjunctivae clear without exudate.  Sclerae anicteric. Oral mucosa is pink, moist.  Oropharynx is pink without lesions or erythema.  Lymph: No  cervical, supraclavicular, or infraclavicular lymphadenopathy noted on palpation.  Cardiovascular: Regular rate and rhythm.Marland Kitchen Respiratory: Clear to auscultation bilaterally. Chest expansion symmetric; breathing non-labored.  Breast: right breast benign, left breast s/p lumpectomy and radiation, no sign of local recurrence GI: Abdomen soft and round; non-tender, non-distended. Bowel sounds normoactive.  GU: Deferred.  Neuro: No focal deficits. Steady gait.  Psych: Mood and affect normal and appropriate for situation.  Extremities: No edema. MSK: No focal spinal tenderness to palpation.  Full range of motion in bilateral upper extremities Skin: Warm and dry.  LABORATORY DATA:  None for this visit.  DIAGNOSTIC IMAGING:  None for this visit.      ASSESSMENT AND PLAN:  Ms.. Newman is a pleasant 68 y.o. female with Stage IA left breast invasive ductal carcinoma, ER+/PR+/HER2-, diagnosed in 01/2018, treated with lumpectomy, adjuvant radiation therapy, and anti-estrogen therapy with Letrozole beginning in 05/2018.  She presents to the Survivorship Clinic for our initial meeting and routine follow-up post-completion of treatment for breast cancer.    1. Stage IA left breast cancer:  Kerry Newman is continuing to recover from definitive treatment for breast cancer. She will follow-up with her medical oncologist, Dr. Lindi Adie in 6 months with history and physical exam per surveillance protocol.  She will continue her anti-estrogen therapy with Letrozole. Thus far, she is tolerating the Letrozole well, with minimal side effects.  Today, a comprehensive survivorship care plan and treatment summary was reviewed with the patient today detailing her breast cancer diagnosis, treatment course, potential late/long-term effects of treatment, appropriate follow-up care with recommendations for the future, and patient education resources.  A copy of this summary, along with a letter will be sent to the patient's primary  care provider via mail/fax/In Basket message after today's visit.     2. Bone health:  Given Kerry Newman's age/history of breast cancer and her current treatment regimen including anti-estrogen therapy with Letrozole, she is at risk for bone demineralization.  Her last DEXA scan was 01/2018, which showed osteopenia with a t score of -1.7.  She will be due for repeat in 01/2020.  In the meantime, she was encouraged to increase her consumption of foods rich in calcium, as well as increase her weight-bearing activities.  She was given education on specific activities to promote bone health.  3. Cancer screening:  Due to Kerry Newman's history and her age, she should receive screening for skin cancers, colon  cancer, and gynecologic cancers.  The information and recommendations are listed on the patient's comprehensive care plan/treatment summary and were reviewed in detail with the patient.    4. Health maintenance and wellness promotion: Kerry Newman was encouraged to consume 5-7 servings of fruits and vegetables per day. We reviewed the "Nutrition Rainbow" handout, as well as the handout "Take Control of Your Health and Reduce Your Cancer Risk" from the [facility].  She was also encouraged to engage in moderate to vigorous exercise for 30 minutes per day most days of the week. We discussed the [employer] fitness program, which is designed for cancer survivors to help them become more physically fit after cancer treatments.  She was instructed to limit her alcohol consumption and continue to abstain from tobacco use.     5. Support services/counseling: It is not uncommon for this period of the patient's cancer care trajectory to be one of many emotions and stressors.  We discussed an opportunity for her to participate in the next session of [facility] ("Finding Your New Normal") support group series designed for patients after they have completed treatment.   Kerry Newman was encouraged to take advantage of our  many other support services programs, support groups, and/or counseling in coping with her new life as a cancer survivor after completing anti-cancer treatment.  She was offered support today through active listening and expressive supportive counseling.  She was given information regarding our available services and encouraged to contact me with any questions or for help enrolling in any of our support group/programs.    Dispo:   -Return to cancer center in 6 months for f/u with Dr. Lindi Adie  -Mammogram due in 01/2019 -Follow up with surgery per Dr. Lucia Gaskins -She is welcome to return back to the Survivorship Clinic at any time; no additional follow-up needed at this time.  -Consider referral back to survivorship as a long-term survivor for continued surveillance  A total of (30) minutes of face-to-face time was spent with this patient with greater than 50% of that time in counseling and care-coordination.   Gardenia Phlegm, NP Survivorship Program [facility] [phone]   Note: PRIMARY CARE PROVIDER [name], [place] [phone]

## 2019-02-01 ENCOUNTER — Other Ambulatory Visit: Payer: Self-pay | Admitting: Adult Health

## 2019-02-01 ENCOUNTER — Ambulatory Visit
Admission: RE | Admit: 2019-02-01 | Discharge: 2019-02-01 | Disposition: A | Discharge status: Home / Self Care | Payer: Medicare HMO | Source: Ambulatory Visit | Attending: Adult Health | Admitting: Adult Health

## 2019-02-01 ENCOUNTER — Other Ambulatory Visit: Payer: Self-pay

## 2019-02-01 DIAGNOSIS — Z170 Estrogen receptor positive status [ER+]: Secondary | ICD-10-CM

## 2019-02-01 DIAGNOSIS — C50412 Malignant neoplasm of upper-outer quadrant of left female breast: Secondary | ICD-10-CM

## 2019-02-01 DIAGNOSIS — R921 Mammographic calcification found on diagnostic imaging of breast: Secondary | ICD-10-CM

## 2019-02-01 HISTORY — DX: Personal history of irradiation: Z92.3

## 2019-02-03 ENCOUNTER — Ambulatory Visit
Admission: RE | Admit: 2019-02-03 | Discharge: 2019-02-03 | Disposition: A | Discharge status: Home / Self Care | Payer: Medicare HMO | Source: Ambulatory Visit | Attending: Adult Health | Admitting: Adult Health

## 2019-02-03 ENCOUNTER — Other Ambulatory Visit: Payer: Self-pay

## 2019-02-03 DIAGNOSIS — R921 Mammographic calcification found on diagnostic imaging of breast: Secondary | ICD-10-CM

## 2019-02-03 NOTE — Assessment & Plan Note (Signed)
03/09/2018:Left lumpectomy: IDC grade 1, 0.9 cm, intermediate grade DCIS, no lymphovascular or perineural invasion, 0/1 lymph node negative, ER 100%, PR 100%, Ki-67 2%, HER-2 negative IHC 1+,T1bN0 stage Ia Oncotype DX recurrence score 7: 3% RRR at 9 years Adjuvant radiation therapy to be completed 05/05/2018  Treatment plan: Adjuvant antiestrogen therapy with letrozole 2.5 mg daily  Letrozole toxicities:  Breast cancer surveillance: Mammogram 02/01/2019: Interval development of indeterminate calcifications at the lumpectomy scar and at the sentinel lymph node biopsy scar.   Stereotactic biopsy 02/03/2019:  Return to clinic in 1 year for follow-up

## 2019-02-07 NOTE — Progress Notes (Signed)
Patient Care Team: [name], MD as PCP - General (Internal Medicine) Alphonsa Overall, MD as Consulting Physician (General Surgery) Nicholas Lose, MD as Consulting Physician (Hematology and Oncology) Eppie Gibson, MD as Attending Physician (Radiation Oncology)  DIAGNOSIS:    ICD-10-CM   1. Malignant neoplasm of upper-outer quadrant of left breast in female, estrogen receptor positive ([place])  C50.412    Z17.0     SUMMARY OF ONCOLOGIC HISTORY: Oncology History  Malignant neoplasm of upper-outer quadrant of left breast in female, estrogen receptor positive ([place])  02/09/2018 Initial Diagnosis   Screening detected left breast mass UOQ at 12:30 position 9 mm size, axilla negative, ultrasound biopsy revealed grade 2 IDC ER 100%, PR 100%, Ki-67 2%, HER-2 negative IHC 1+, T1b N0 stage Ia AJCC 8   02/18/2018 Genetic Testing   Genetic testing was negative and didn't detect any deleterious mutations.  Variant of Uncertain Significance: MUTYH c.1508G>A (p.Gly503Gl), SDHA c.1919A>G (p.Glu640Gly),SMAD4 c.521C>A (p.Thr174Asn), TERT c.403G>A (p.Gly135Arg). Genes tested include: AIP, ALK, APC, ATM,  AXIN2, BAP1, BARD1, BLM, BMPR1A, BRCA1, BRCA2, BRIP1, CASR, CDC73, CDH1, CDK4, CDKN1B, CDKN1C, CDKN2A (p14ARF), CDKN2A (p16INK4a), CEBPA, CHEK2, CTNNA1, DICER1, DIS3L2, EGFR, EPCAM*, FH , FLCN, GATA2, GPC3, GREM1*, HOXB13, HRAS, KIT, MAX, MEN1, MET, MITF, MLH1, MSH2, MSH3, MSH6, MUTYH, NBN, NF1, NF2, NTHL1, PALB2, PDGFRA, PHOX2B*, PMS2, POLD1, POLE, POT1, PRKAR1A, PTCH1, PTEN, RAD50, RAD51C, RAD51D, RB1, RECQL4, RET, RUNX1, SDHA*, SDHAF2, SDHB, SDHC, SDHD, SMAD4, SMARCA4, SMARCB1,SMARCE1, STK11, SUFU, TERC, TERT, TMEM127,  TP53, TSC1, TSC2, VHL, WRN*, WT1    03/09/2018 Surgery   Left lumpectomy: IDC grade 1, 0.9 cm, intermediate grade DCIS, no lymphovascular or perineural invasion, 0/1 lymph node negative,  ER 100%, PR 100%, Ki-67 2%, HER-2 negative IHC 1+,T1bN0 stage Ia   03/09/2018 Oncotype testing   7/3%    03/17/2018 Cancer Staging   Staging form: Breast, AJCC 8th Edition - Pathologic: Stage IA (pT1b, pN0, cM0, G1, ER+, PR+, HER2-) - Signed by Nicholas Lose, MD on 03/17/2018   04/13/2018 - 05/10/2018 Radiation Therapy   Radiation with Isidore Moos 1. Left Breast / 40.05 Gy in 15 fractions 2. Left Breast Boost / 10 Gy in 5 fractions   05/2018 -  Anti-estrogen oral therapy   Letrozole daily     CHIEF COMPLIANT: Follow-up of left breast cancer on letrozole therapy  INTERVAL HISTORY: Kerry Newman is a 68 y.o. with above-mentioned history of left breast cancer treated with lumpectomy, radiation, and who is currently on anti-estrogen therapy with letrozole. I last saw her 9 months ago. Mammogram on 02/01/19 showed calcifications in the left breast at the lumpectomy scar and sentinel lymph node biopsy scar. Biopsy on 02/03/19 showed a benign lymph node in the axilla and fat necrosis, a hematoma, and calcifications in the breast. She presents to the clinic today for follow-up.   REVIEW OF SYSTEMS:   Constitutional: Denies fevers, chills or abnormal weight loss Eyes: Denies blurriness of vision Ears, nose, mouth, throat, and face: Denies mucositis or sore throat Respiratory: Denies cough, dyspnea or wheezes Cardiovascular: Denies palpitation, chest discomfort Gastrointestinal: Denies nausea, heartburn or change in bowel habits Skin: Denies abnormal skin rashes Lymphatics: Denies new lymphadenopathy or easy bruising Neurological: Denies numbness, tingling or new weaknesses Behavioral/Psych: Mood is stable, no new changes  Extremities: No lower extremity edema Breast: denies any pain or lumps or nodules in either breasts All other systems were reviewed with the patient and are negative.  I have reviewed the past medical history, past surgical history, social history and  family history with the patient and they are unchanged from previous note.  ALLERGIES:  is allergic to [place] [brimonidine  tartrate-timolol].  MEDICATIONS:  Current Outpatient Medications  Medication Sig Dispense Refill  . acetaminophen (TYLENOL) 325 MG tablet Take 650 mg by mouth every 6 (six) hours as needed.    . calcium gluconate 500 MG tablet Take 1 tablet by mouth 3 (three) times daily.    Marland Kitchen letrozole (FEMARA) 2.5 MG tablet Take 1 tablet (2.5 mg total) by mouth daily. 90 tablet 3  . Multiple Vitamin (MULTIVITAMIN WITH MINERALS) TABS Take 1 tablet by mouth daily.     No current facility-administered medications for this visit.     PHYSICAL EXAMINATION: ECOG PERFORMANCE STATUS: 1 - Symptomatic but completely ambulatory  Vitals:   02/08/19 1416  BP: 140/73  Pulse: 78  Resp: 20  Temp: 98.3 F (36.8 C)  SpO2: 100%   Filed Weights   02/08/19 1416  Weight: 157 lb (71.2 kg)    GENERAL: alert, no distress and comfortable SKIN: skin color, texture, turgor are normal, no rashes or significant lesions EYES: normal, Conjunctiva are pink and non-injected, sclera clear OROPHARYNX: no exudate, no erythema and lips, buccal mucosa, and tongue normal  NECK: supple, thyroid normal size, non-tender, without nodularity LYMPH: no palpable lymphadenopathy in the cervical, axillary or inguinal LUNGS: clear to auscultation and percussion with normal breathing effort HEART: regular rate & rhythm and no murmurs and no lower extremity edema ABDOMEN: abdomen soft, non-tender and normal bowel sounds MUSCULOSKELETAL: no cyanosis of digits and no clubbing  NEURO: alert & oriented x 3 with fluent speech, no focal motor/sensory deficits EXTREMITIES: No lower extremity edema BREAST: No palpable masses or nodules in either right or left breasts. No palpable axillary supraclavicular or infraclavicular adenopathy no breast tenderness or nipple discharge. (exam performed in the presence of a chaperone)  LABORATORY DATA:  I have reviewed the data as listed CMP Latest Ref Rng & Units 02/17/2018 09/10/2016  Glucose 70 - 99 mg/dL  103(H) 117(H)  BUN 8 - 23 mg/dL 9 13  Creatinine 0.44 - 1.00 mg/dL 0.94 0.76  Sodium 135 - 145 mmol/L 142 139  Potassium 3.5 - 5.1 mmol/L 3.9 3.7  Chloride 98 - 111 mmol/L 105 104  CO2 22 - 32 mmol/L 30 27  Calcium 8.9 - 10.3 mg/dL 9.5 9.0  Total Protein 6.5 - 8.1 g/dL 8.4(H) 8.0  Total Bilirubin 0.3 - 1.2 mg/dL 0.9 1.3(H)  Alkaline Phos 38 - 126 U/L 118 83  AST 15 - 41 U/L 23 24  ALT 0 - 44 U/L 23 22    Lab Results  Component Value Date   WBC 3.0 (L) 02/17/2018   HGB 13.0 02/17/2018   HCT 39.3 02/17/2018   MCV 94.4 02/17/2018   PLT 217 02/17/2018   NEUTROABS 1.5 02/17/2018    ASSESSMENT & PLAN:  Malignant neoplasm of upper-outer quadrant of left breast in female, estrogen receptor positive ([place]) 03/09/2018:Left lumpectomy: IDC grade 1, 0.9 cm, intermediate grade DCIS, no lymphovascular or perineural invasion, 0/1 lymph node negative, ER 100%, PR 100%, Ki-67 2%, HER-2 negative IHC 1+,T1bN0 stage Ia Oncotype DX recurrence score 7: 3% RRR at 9 years Adjuvant radiation therapy to be completed 05/05/2018  Treatment plan: Adjuvant antiestrogen therapy with letrozole 2.5 mg daily  Letrozole toxicities: Denies any hot flashes or myalgias. I renewed her prescription for letrozole for another year.  Breast cancer surveillance: Mammogram 02/01/2019: Interval development of indeterminate calcifications at the lumpectomy scar  and at the sentinel lymph node biopsy scar.   Stereotactic biopsy 02/03/2019: Benign fat necrosis Patient will get every 41-monthmammograms on the left breast for further evaluation.  Return to clinic in 1 year for follow-up    No orders of the defined types were placed in this encounter.  The patient has a good understanding of the overall plan. she agrees with it. she will call with any problems that may develop before the next visit here.  GNicholas Lose MD 02/08/2019  IJulious OkaDorshimer am acting as scribe for Dr. VNicholas Lose  I have reviewed the  above documentation for accuracy and completeness, and I agree with the above.

## 2019-02-08 ENCOUNTER — Other Ambulatory Visit: Payer: Self-pay

## 2019-02-08 ENCOUNTER — Inpatient Hospital Stay: Payer: Medicare HMO | Attending: Hematology and Oncology | Admitting: Hematology and Oncology

## 2019-02-08 DIAGNOSIS — Z79811 Long term (current) use of aromatase inhibitors: Secondary | ICD-10-CM | POA: Insufficient documentation

## 2019-02-08 DIAGNOSIS — C50412 Malignant neoplasm of upper-outer quadrant of left female breast: Secondary | ICD-10-CM

## 2019-02-08 DIAGNOSIS — M7989 Other specified soft tissue disorders: Secondary | ICD-10-CM | POA: Diagnosis not present

## 2019-02-08 DIAGNOSIS — Z170 Estrogen receptor positive status [ER+]: Secondary | ICD-10-CM | POA: Diagnosis not present

## 2019-02-08 DIAGNOSIS — Z79899 Other long term (current) drug therapy: Secondary | ICD-10-CM | POA: Insufficient documentation

## 2019-02-08 DIAGNOSIS — Z923 Personal history of irradiation: Secondary | ICD-10-CM | POA: Insufficient documentation

## 2019-02-08 MED ORDER — LETROZOLE 2.5 MG PO TABS: 2.5000 mg | ORAL_TABLET | Freq: Every day | ORAL | 3 refills | Status: DC

## 2019-07-04 ENCOUNTER — Other Ambulatory Visit: Payer: Self-pay | Admitting: Hematology and Oncology

## 2019-07-04 DIAGNOSIS — R921 Mammographic calcification found on diagnostic imaging of breast: Secondary | ICD-10-CM

## 2019-08-05 ENCOUNTER — Ambulatory Visit
Admission: RE | Admit: 2019-08-05 | Discharge: 2019-08-05 | Disposition: A | Discharge status: Home / Self Care | Payer: Medicare HMO | Source: Ambulatory Visit | Attending: Hematology and Oncology | Admitting: Hematology and Oncology

## 2019-08-05 ENCOUNTER — Other Ambulatory Visit: Payer: Self-pay

## 2019-08-05 DIAGNOSIS — R921 Mammographic calcification found on diagnostic imaging of breast: Secondary | ICD-10-CM

## 2019-08-21 ENCOUNTER — Ambulatory Visit: Payer: Medicare HMO

## 2019-08-28 ENCOUNTER — Ambulatory Visit: Payer: Medicare HMO | Attending: Internal Medicine

## 2019-08-28 DIAGNOSIS — Z23 Encounter for immunization: Secondary | ICD-10-CM | POA: Insufficient documentation

## 2019-08-28 NOTE — Progress Notes (Signed)
   Covid-19 Vaccination Clinic  Name:  Kerry Newman    MRN: [id number] DOB: 06/30/1950  08/28/2019  [name] was observed post Covid-19 immunization for 15 minutes without incident. She was provided with Vaccine Information Sheet and instruction to access the V-Safe system.   [name] was instructed to call 911 with any severe reactions post vaccine: Marland Kitchen Difficulty breathing  . Swelling of face and throat  . A fast heartbeat  . A bad rash all over body  . Dizziness and weakness   Immunizations Administered    Name Date Dose VIS Date Route   Pfizer COVID-19 Vaccine 08/28/2019 11:20 AM 0.3 mL 06/03/2019 Intramuscular   Manufacturer: [employer]   Lot: [id number]   [place]: [id number]

## 2019-09-22 IMAGING — US ULTRASOUND LEFT BREAST LIMITED
1 series · 13 of 13 positions shown · non-contrast
Comparison: Previous exams including recent screening mammogram
dated 01/29/2018.

CLINICAL DATA: Patient returns today to evaluate a possible LEFT
breast mass identified on recent screening mammogram

EXAM:
DIGITAL DIAGNOSTIC LEFT MAMMOGRAM WITH CAD AND TOMO
ULTRASOUND LEFT BREAST

[Series 1: ultrasound left breast limited · 0.06mm/px · 13 of 13 slices shown]
[im 1/13]
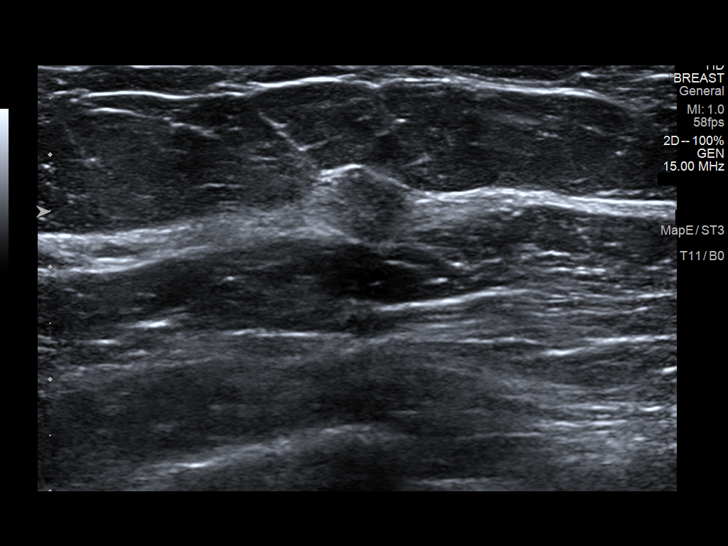
[im 2/13]
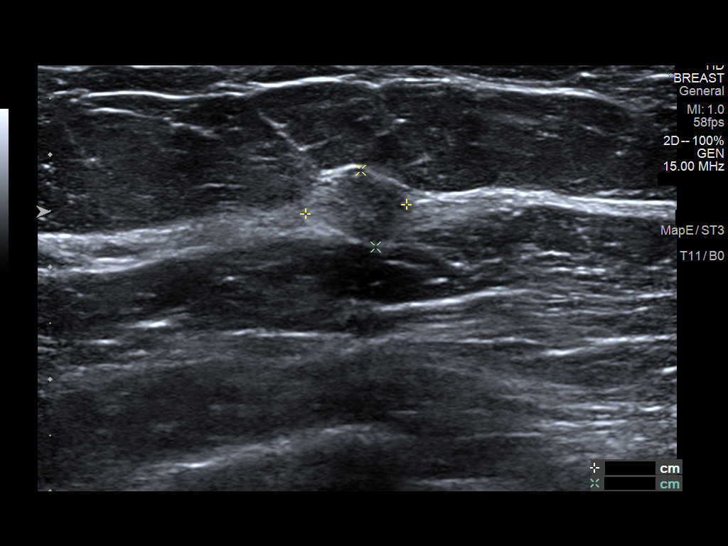
[im 3/13]
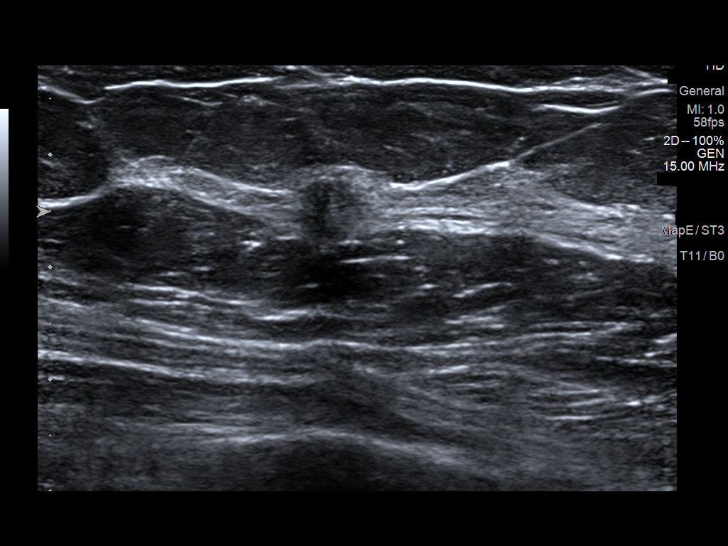
[im 4/13]
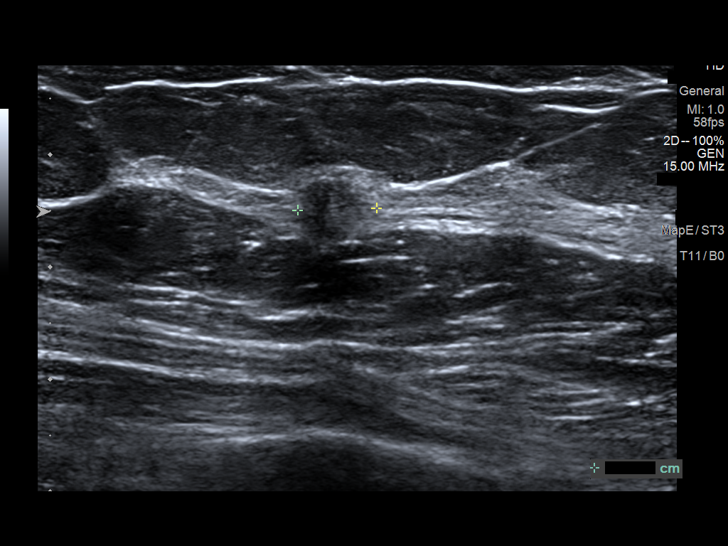
[im 5/13]
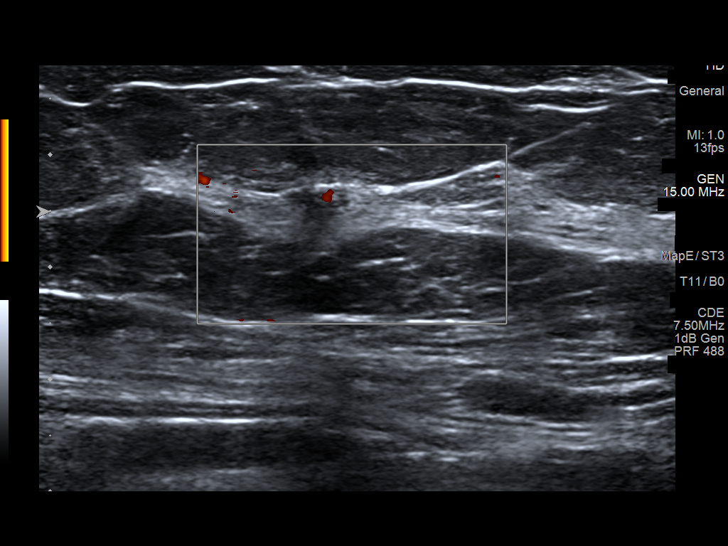
[im 6/13]
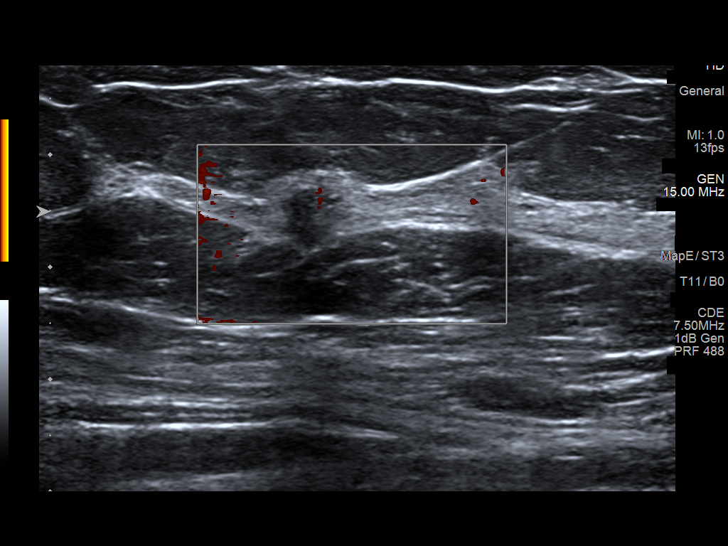
[im 7/13]
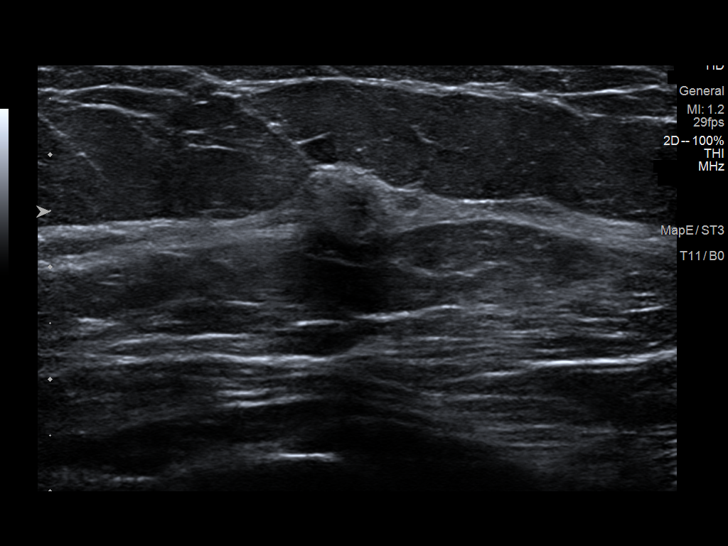
[im 8/13]
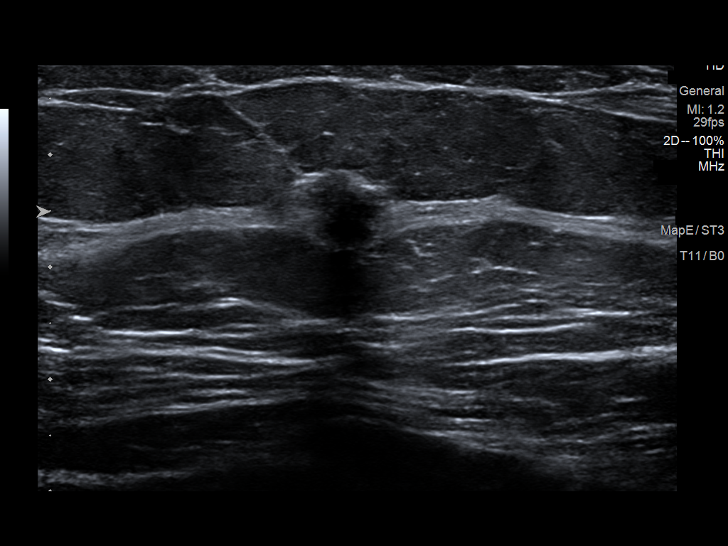
[im 9/13]
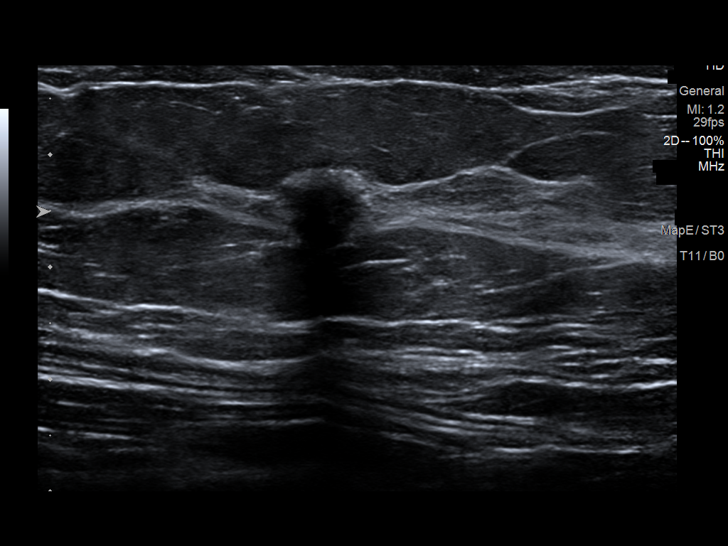
[im 10/13]
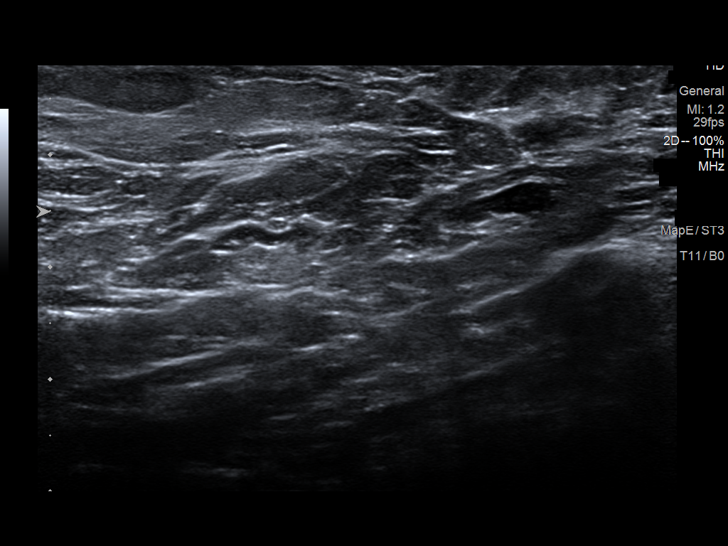
[im 11/13]
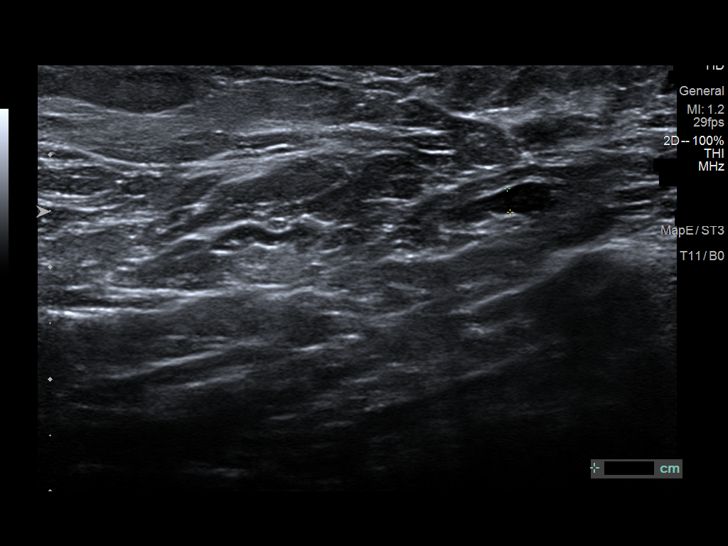
[im 12/13]
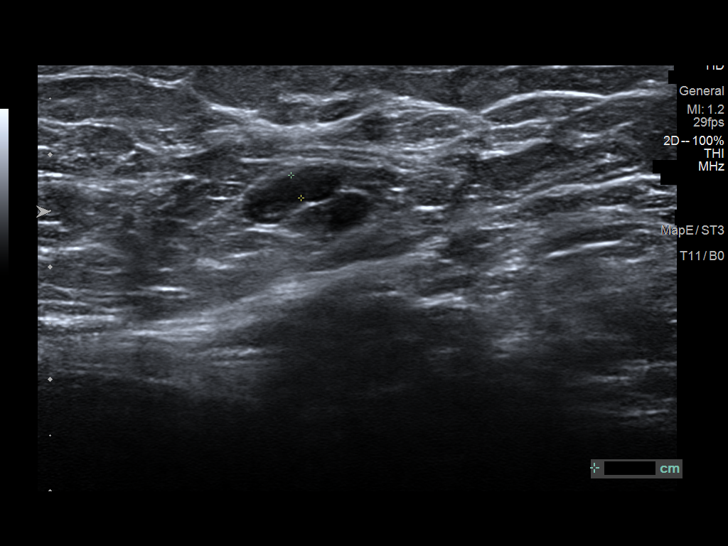
[im 13/13]
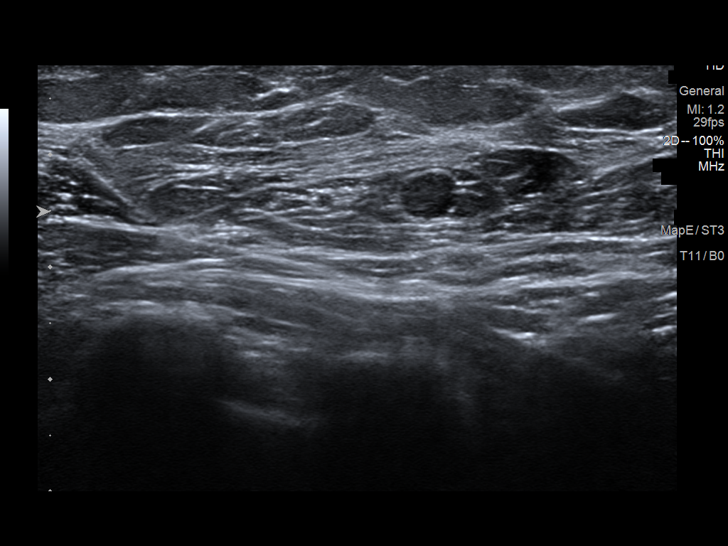

[13 of 13 positions shown; findings below may reference images not displayed]

ACR Breast Density Category c: The breast tissue is heterogeneously
dense, which may obscure small masses.
FINDINGS: On today's additional diagnostic views with spot compression and 3D
tomosynthesis, an irregular mass is confirmed within the upper-outer
quadrant, at posterior depth, measuring approximately 8 mm greatest
dimension, with associated architectural distortion.

Mammographic images were processed with CAD.

Targeted ultrasound is performed, showing an irregular hypoechoic
mass within the LEFT breast at the 12:30 o'clock axis, 5 cm from the
nipple, measuring 9 mm, corresponding to the mammographic finding.

LEFT axilla was evaluated with ultrasound showing no enlarged or
morphologically abnormal lymph nodes.
IMPRESSION: Irregular mass within the LEFT breast at the 12:30 o'clock axis, 5
cm from the nipple, measuring 9 mm, corresponding to the
mammographic finding. This is a highly suspicious finding for which
ultrasound-guided biopsy is recommended.

RECOMMENDATION:
Ultrasound-guided biopsy of the LEFT breast mass at the 12:30
o'clock axis.

Ultrasound-guided biopsy will be performed later today.

I have discussed the findings and recommendations with the patient.
Results were also provided in writing at the conclusion of the
visit. If applicable, a reminder letter will be sent to the patient
regarding the next appointment.

BI-RADS CATEGORY  5: Highly suggestive of malignancy.

## 2019-09-22 IMAGING — US US BREAST BX W LOC DEV 1ST LESION IMG BX SPEC US GUIDE*L*
1 series · 8 of 8 positions shown · non-contrast
Comparison: Previous exam(s).

ADDENDUM:
Pathology revealed GRADE II - INVASIVE DUCTAL CARCINOMA of LEFT
breast, 12:30 o'clock mass. This was found to be concordant by Dr.
Vladi Riley.

Pathology results were discussed with the patient by Kutsu Nenemba,
RN, by telephone. The patient reported doing well after the biopsy
with tenderness at the site. Post biopsy instructions and care were
reviewed and questions were answered. The patient was encouraged to
call The [REDACTED] for any additional
concerns.
The patient was referred to [REDACTED]
[REDACTED] at [REDACTED] on
February 17, 2018.
Pathology results reported by Trinh Augustine, RN on 02/10/2018.
CLINICAL DATA: Left breast 12:30 o'clock suspicious mass.
EXAM:
ULTRASOUND GUIDED LEFT BREAST CORE NEEDLE BIOPSY

[Series 1: us breast bx w loc dev 1st lesion img bx spec us g · 0.06mm/px · 8 of 8 slices shown]
[im 1/8]
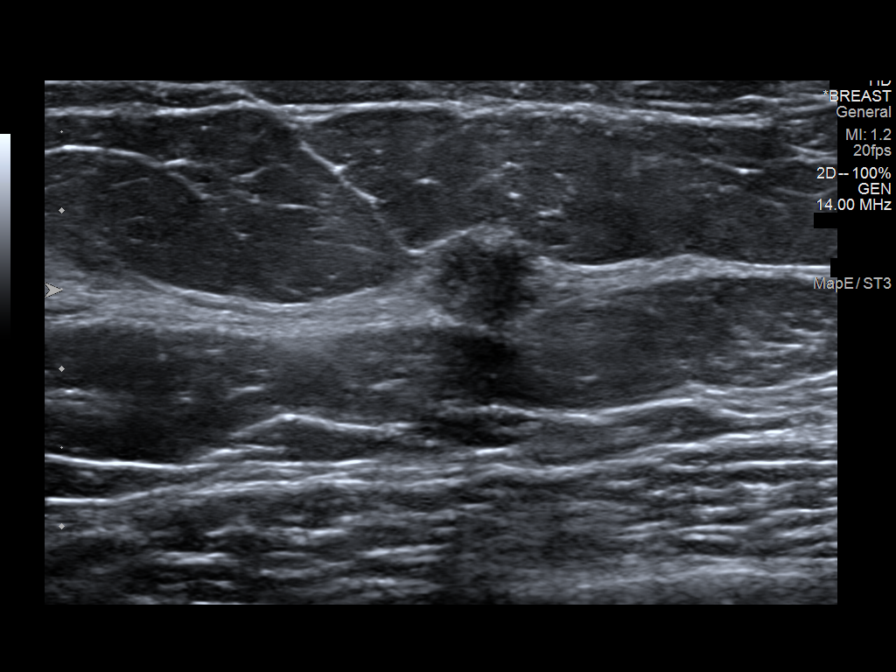
[im 2/8]
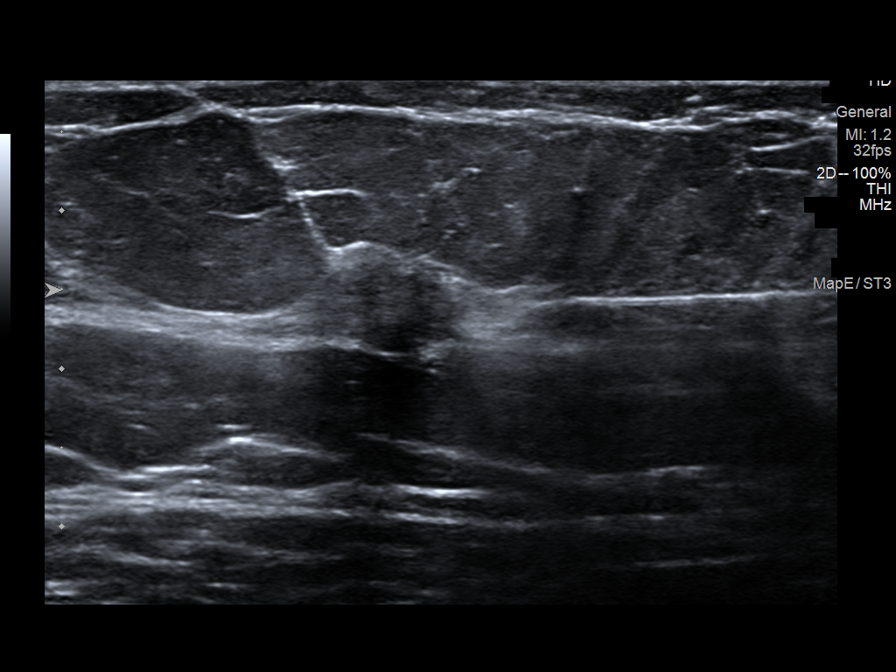
[im 3/8]
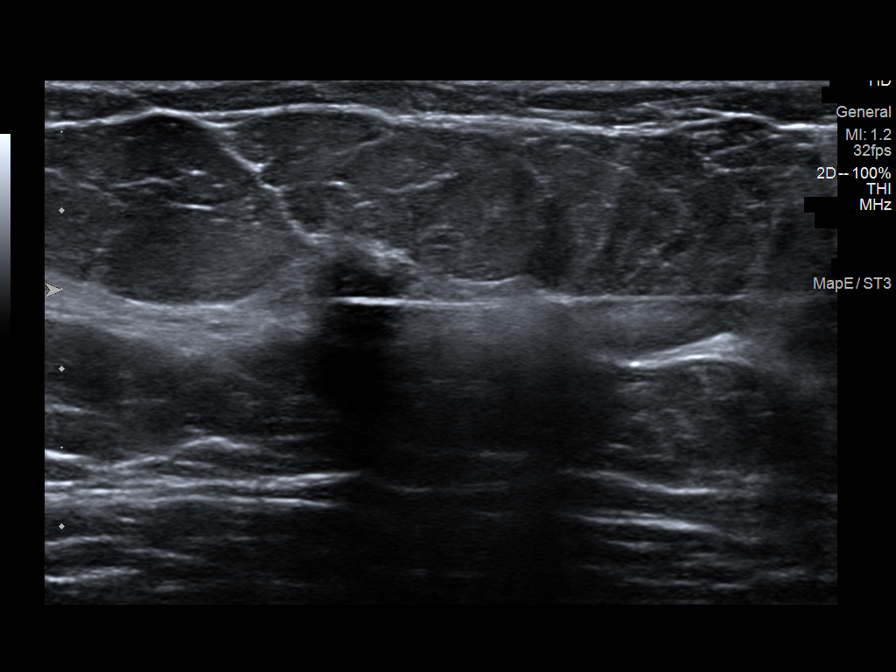
[im 4/8]
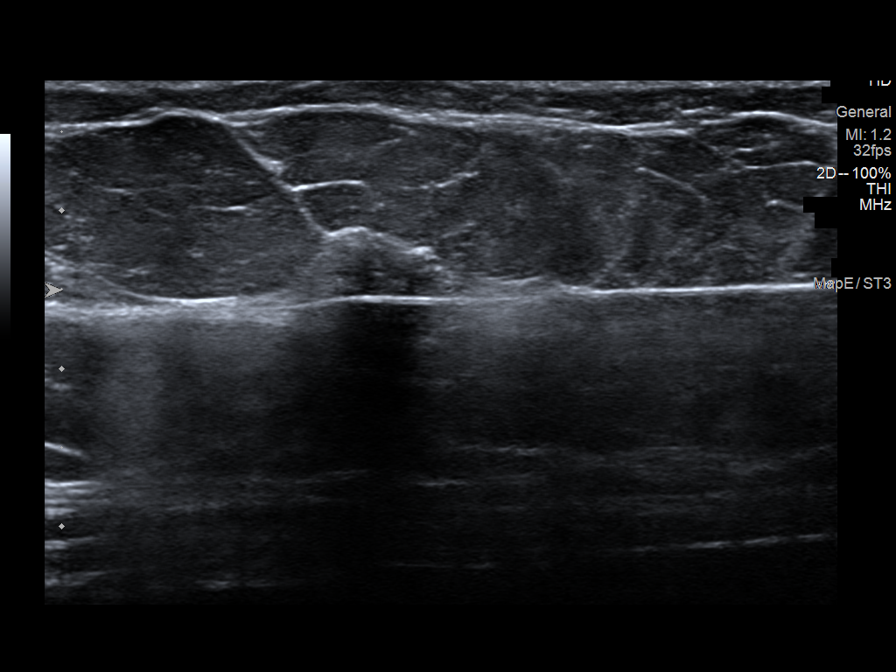
[im 5/8]
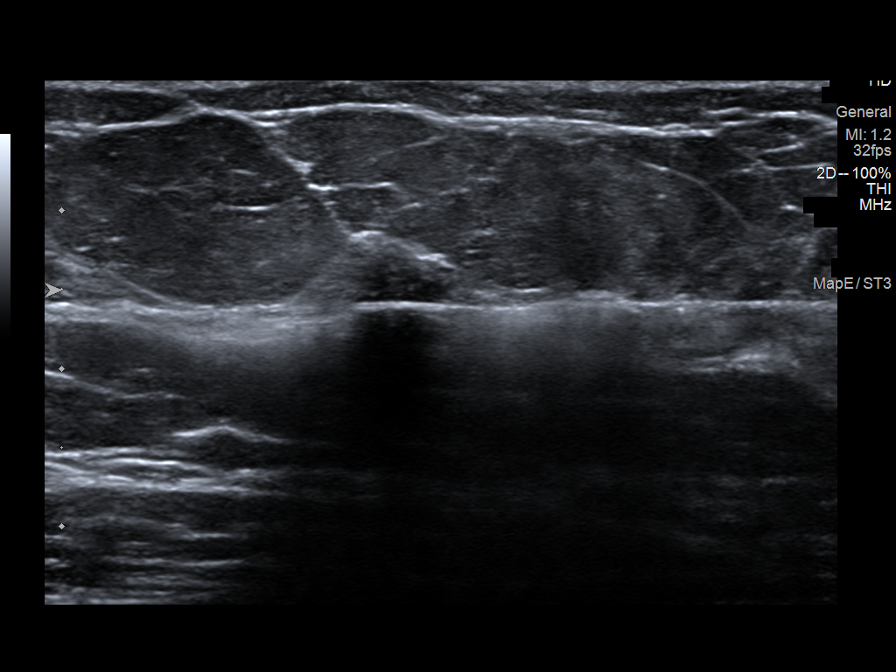
[im 6/8]
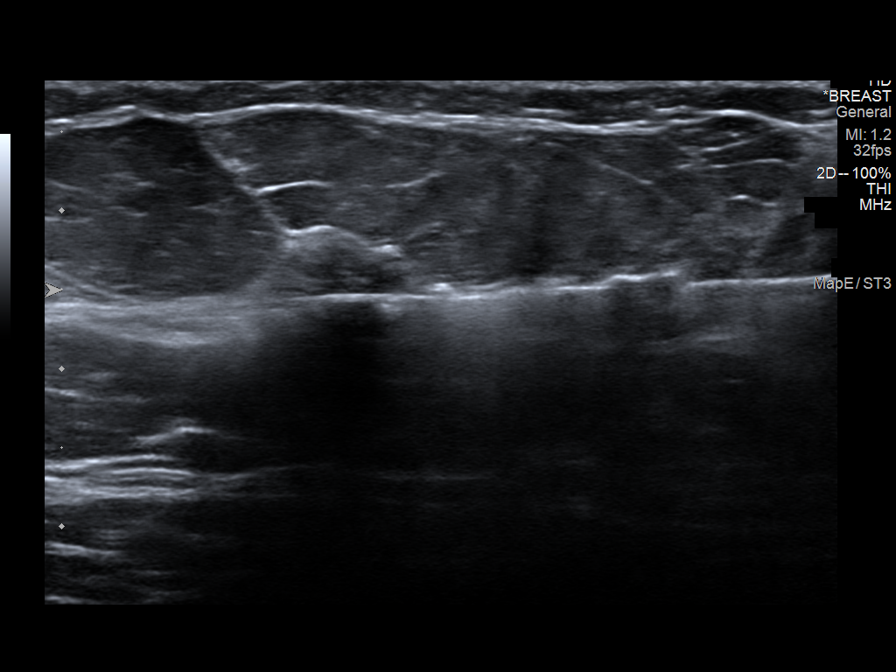
[im 7/8]
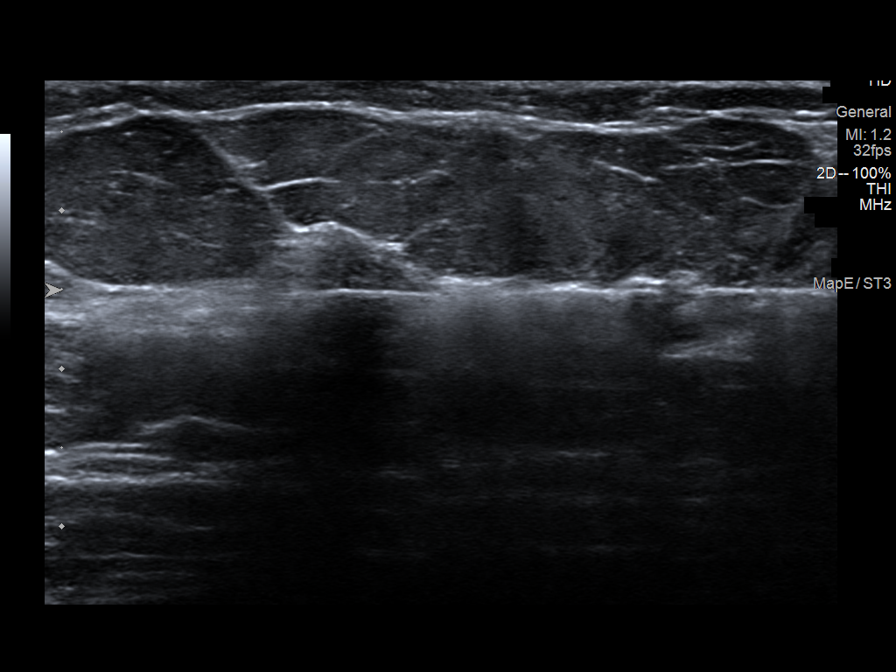
[im 8/8]
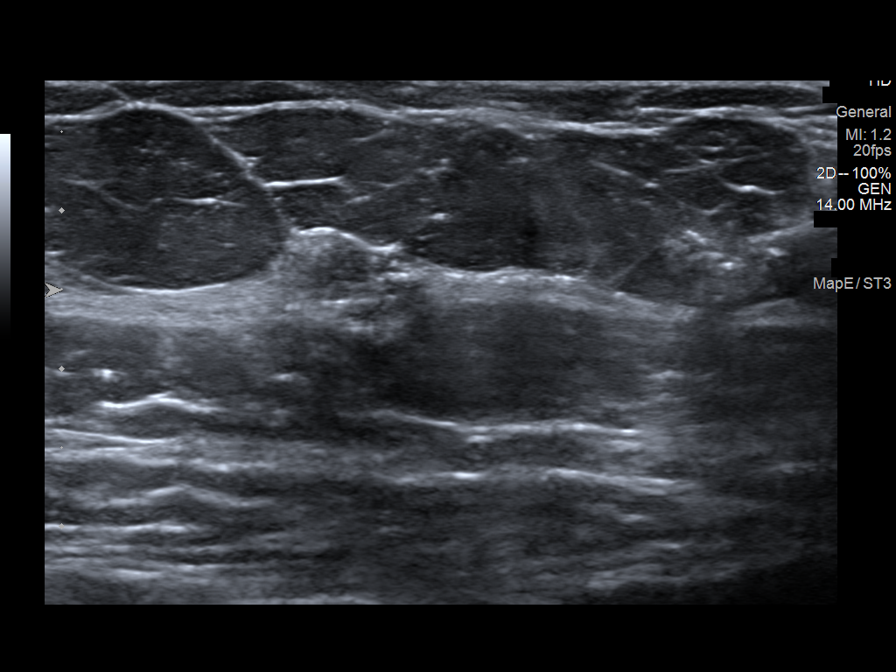

[8 of 8 positions shown; findings below may reference images not displayed]



Lesion quadrant: Upper outer quadrant

Using sterile technique and 1% Lidocaine as local anesthetic, under
direct ultrasound visualization, a 14 gauge Paulus device was
used to perform biopsy of left breast 12:30 o'clock mass using a
lateral approach. At the conclusion of the procedure a ribbon shaped
tissue marker clip was deployed into the biopsy cavity. Follow up 2
view mammogram was performed and dictated separately.
IMPRESSION: Ultrasound guided biopsy of left breast.  No apparent complications.

## 2019-09-28 ENCOUNTER — Ambulatory Visit: Payer: Medicare HMO | Attending: Internal Medicine

## 2019-09-28 DIAGNOSIS — Z23 Encounter for immunization: Secondary | ICD-10-CM

## 2019-09-28 NOTE — Progress Notes (Signed)
   Covid-19 Vaccination Clinic  Name:  Kerry Newman    MRN: [id number] DOB: 11/07/1950  09/28/2019  [name] was observed post Covid-19 immunization for 15 minutes without incident. She was provided with Vaccine Information Sheet and instruction to access the V-Safe system.   [name] was instructed to call 911 with any severe reactions post vaccine: Marland Kitchen Difficulty breathing  . Swelling of face and throat  . A fast heartbeat  . A bad rash all over body  . Dizziness and weakness   Immunizations Administered    Name Date Dose VIS Date Route   Pfizer COVID-19 Vaccine 09/28/2019 10:34 AM 0.3 mL 06/03/2019 Intramuscular   Manufacturer: [employer]   Lot: [id number]   [place]: [id number]

## 2019-11-28 ENCOUNTER — Other Ambulatory Visit: Payer: Self-pay | Admitting: Internal Medicine

## 2019-11-28 DIAGNOSIS — R921 Mammographic calcification found on diagnostic imaging of breast: Secondary | ICD-10-CM

## 2020-01-18 ENCOUNTER — Encounter (HOSPITAL_BASED_OUTPATIENT_CLINIC_OR_DEPARTMENT_OTHER): Payer: Self-pay | Admitting: *Deleted

## 2020-01-18 ENCOUNTER — Other Ambulatory Visit: Payer: Self-pay

## 2020-01-18 ENCOUNTER — Emergency Department (HOSPITAL_BASED_OUTPATIENT_CLINIC_OR_DEPARTMENT_OTHER)
Admission: EM | Admit: 2020-01-18 | Discharge: 2020-01-18 | Disposition: A | Discharge status: Home / Self Care | Payer: Medicare HMO | Attending: Emergency Medicine | Admitting: Emergency Medicine

## 2020-01-18 ENCOUNTER — Emergency Department (HOSPITAL_BASED_OUTPATIENT_CLINIC_OR_DEPARTMENT_OTHER): Payer: Medicare HMO

## 2020-01-18 DIAGNOSIS — R112 Nausea with vomiting, unspecified: Secondary | ICD-10-CM | POA: Diagnosis not present

## 2020-01-18 DIAGNOSIS — E860 Dehydration: Secondary | ICD-10-CM | POA: Insufficient documentation

## 2020-01-18 DIAGNOSIS — R1032 Left lower quadrant pain: Secondary | ICD-10-CM | POA: Insufficient documentation

## 2020-01-18 DIAGNOSIS — Z853 Personal history of malignant neoplasm of breast: Secondary | ICD-10-CM | POA: Insufficient documentation

## 2020-01-18 DIAGNOSIS — R42 Dizziness and giddiness: Secondary | ICD-10-CM | POA: Diagnosis not present

## 2020-01-18 LAB — CBC WITH DIFFERENTIAL/PLATELET
Abs Immature Granulocytes: 0.18 10*3/uL — ABNORMAL HIGH (ref 0.00–0.07)
Basophils Absolute: 0 10*3/uL (ref 0.0–0.1)
Basophils Relative: 0 %
Eosinophils Absolute: 0 10*3/uL (ref 0.0–0.5)
Eosinophils Relative: 0 %
HCT: 37 % (ref 36.0–46.0)
Hemoglobin: 12 g/dL (ref 12.0–15.0)
Immature Granulocytes: 2 %
Lymphocytes Relative: 12 %
Lymphs Abs: 1.1 10*3/uL (ref 0.7–4.0)
MCH: 30.9 pg (ref 26.0–34.0)
MCHC: 32.4 g/dL (ref 30.0–36.0)
MCV: 95.4 fL (ref 80.0–100.0)
Monocytes Absolute: 0.9 10*3/uL (ref 0.1–1.0)
Monocytes Relative: 10 %
Neutro Abs: 6.9 10*3/uL (ref 1.7–7.7)
Neutrophils Relative %: 76 %
Platelets: 198 10*3/uL (ref 150–400)
RBC: 3.88 MIL/uL (ref 3.87–5.11)
RDW: 12.8 % (ref 11.5–15.5)
WBC: 9.2 10*3/uL (ref 4.0–10.5)
nRBC: 0 % (ref 0.0–0.2)

## 2020-01-18 LAB — COMPREHENSIVE METABOLIC PANEL
ALT: 25 U/L (ref 0–44)
AST: 25 U/L (ref 15–41)
Albumin: 4 g/dL (ref 3.5–5.0)
Alkaline Phosphatase: 91 U/L (ref 38–126)
Anion gap: 12 (ref 5–15)
BUN: 14 mg/dL (ref 8–23)
CO2: 25 mmol/L (ref 22–32)
Calcium: 9 mg/dL (ref 8.9–10.3)
Chloride: 101 mmol/L (ref 98–111)
Creatinine, Ser: 0.85 mg/dL (ref 0.44–1.00)
GFR calc Af Amer: >60 mL/min (ref >60)
GFR calc non Af Amer: >60 mL/min (ref >60)
Glucose, Bld: 162 mg/dL — ABNORMAL HIGH (ref 70–99)
Potassium: 3.7 mmol/L (ref 3.5–5.1)
Sodium: 138 mmol/L (ref 135–145)
Total Bilirubin: 1 mg/dL (ref 0.3–1.2)
Total Protein: 7.7 g/dL (ref 6.5–8.1)

## 2020-01-18 LAB — OCCULT BLOOD X 1 CARD TO LAB, STOOL: Fecal Occult Bld: NEGATIVE

## 2020-01-18 LAB — LIPASE, BLOOD: Lipase: 25 U/L (ref 11–51)

## 2020-01-18 LAB — TROPONIN I (HIGH SENSITIVITY): Troponin I (High Sensitivity): 2 ng/L (ref <18)

## 2020-01-18 MED ORDER — IOHEXOL 300 MG/ML  SOLN
100.0000 mL | Freq: Once | INTRAMUSCULAR | Status: AC
  Administered 2020-01-18: 100 mL via INTRAVENOUS

## 2020-01-18 MED ORDER — SODIUM CHLORIDE 0.9 % IV BOLUS
1000.0000 mL | Freq: Once | INTRAVENOUS | Status: DC
Start: 2020-01-18 — End: 2020-01-18

## 2020-01-18 MED ORDER — DIPHENHYDRAMINE HCL 50 MG/ML IJ SOLN
25.0000 mg | Freq: Once | INTRAMUSCULAR | Status: AC
  Administered 2020-01-18: 25 mg via INTRAVENOUS
  Filled 2020-01-18: qty 1

## 2020-01-18 MED ORDER — SODIUM CHLORIDE 0.9 % IV BOLUS
1000.0000 mL | Freq: Once | INTRAVENOUS | Status: AC
  Administered 2020-01-18: 1000 mL via INTRAVENOUS

## 2020-01-18 MED ORDER — METOCLOPRAMIDE HCL 5 MG/ML IJ SOLN: 5.0000 mg | Freq: Once | INTRAMUSCULAR | Status: DC

## 2020-01-18 MED ORDER — PROCHLORPERAZINE MALEATE 10 MG PO TABS: 10.0000 mg | ORAL_TABLET | Freq: Four times a day (QID) | ORAL | 0 refills | Status: AC | PRN

## 2020-01-18 MED ORDER — PROCHLORPERAZINE EDISYLATE 10 MG/2ML IJ SOLN
10.0000 mg | Freq: Once | INTRAMUSCULAR | Status: AC
  Administered 2020-01-18: 10 mg via INTRAVENOUS
  Filled 2020-01-18: qty 2

## 2020-01-18 MED ORDER — ONDANSETRON HCL 4 MG/2ML IJ SOLN: 4.0000 mg | Freq: Once | INTRAMUSCULAR | Status: DC

## 2020-01-18 NOTE — ED Notes (Addendum)
Bedside commode at bedside for UA

## 2020-01-18 NOTE — ED Notes (Signed)
Patient transported to CT 

## 2020-01-18 NOTE — ED Notes (Signed)
PT in pain and unable to lay back for 12 Lead EKG. MD aware, RN aware. Plans for further medications and retry EKG when PT becomes more stable.

## 2020-01-18 NOTE — ED Notes (Signed)
ED Provider at bedside. 

## 2020-01-18 NOTE — ED Triage Notes (Addendum)
Brought in by EMS form home c/o  Sudden onset of n/v. Denies abd pain . PTA zofran 4 IV

## 2020-01-18 NOTE — ED Provider Notes (Signed)
[facility] EMERGENCY DEPARTMENT Provider Note   CSN: [id number] Arrival date & time: 01/18/20  1503    History Chief Complaint  Patient presents with  . Emesis   Kerry Newman is 69yo female with hx of breast cancer presents to ED via EMS with n/v. Reports she was in usual state of health until around noon today when she became nauseous, 63min after consuming smoothie made by husband w/ fruits & sweet potatoes. Reports she had vomited "100 times since then." Patient is not sure, but husband reports some blood in vomiting. She says she felt dizzy and had to sit down before she passed out in the beginning of episode. States BM over last few days have been normal, including 1 this morning. Denies abdominal pain, but continues to feel like she will throw up. Denies hematochezia, fevers, CP, SOB. Of note, patient had syncope in 2018, work-up revealed lower GI bleed.      Past Medical History:  Diagnosis Date  . Family history of breast cancer   . Family history of colon cancer   . Family history of kidney cancer   . Family history of prostate cancer   . History of radiation therapy 04/13/18- 05/10/18   Left Breast 40.05 Gy in 15 fractions, Left breast boost 10 gy in 5 fractions  . Malignant neoplasm of upper-outer quadrant of left breast in female, estrogen receptor positive ([place]) 02/12/2018  . Personal history of radiation therapy     Patient Active Problem List   Diagnosis Date Noted  . Family history of breast cancer   . Family history of colon cancer   . Family history of prostate cancer   . Family history of kidney cancer   . Malignant neoplasm of upper-outer quadrant of left breast in female, estrogen receptor positive ([place]) 02/12/2018   Past Surgical History:  Procedure Laterality Date  . BREAST EXCISIONAL BIOPSY Left   . BREAST LUMPECTOMY Left 02/2018  . BREAST LUMPECTOMY WITH RADIOACTIVE SEED AND SENTINEL LYMPH NODE BIOPSY Left 03/09/2018   Procedure: LEFT BREAST  LUMPECTOMY WITH RADIOACTIVE SEED AND SENTINEL LYMPH NODE BIOPSY;  Surgeon: Alphonsa Overall, MD;  Location: [facility];  Service: General;  Laterality: Left;  . CESAREAN SECTION      OB History   No obstetric history on file.    Family History  Problem Relation Age of Onset  . Breast cancer Sister   . Prostate cancer Father   . Liver cancer Brother   . Kidney cancer Brother   . Stomach cancer Sister    Social History   Tobacco Use  . Smoking status: Never Smoker  . Smokeless tobacco: Never Used  Vaping Use  . Vaping Use: Never used  Substance Use Topics  . Alcohol use: No  . Drug use: No    Home Medications Prior to Admission medications   Medication Sig Start Date End Date Taking? Authorizing Provider  calcium gluconate 500 MG tablet Take 1 tablet by mouth 3 (three) times daily.   Yes [provider]  letrozole (FEMARA) 2.5 MG tablet Take 1 tablet (2.5 mg total) by mouth daily. 02/08/19  Yes Nicholas Lose, MD  Multiple Vitamin (MULTIVITAMIN WITH MINERALS) TABS Take 1 tablet by mouth daily.   Yes [provider]  acetaminophen (TYLENOL) 325 MG tablet Take 650 mg by mouth every 6 (six) hours as needed.    [provider]   Allergies    Combigan [brimonidine tartrate-timolol]  Review of Systems  Review of Systems  Physical Exam Updated Vital Signs BP (!) 135/52 (BP Location: Right Arm)   Pulse 72   Temp 97.7 F (36.5 C) (Oral)   Resp 20   Ht 5' 8.5" (1.74 m)   Wt 74.4 kg   SpO2 99%   BMI 24.57 kg/m   Physical Exam Constitutional:      General: She is awake.     Appearance: She is ill-appearing.  HENT:     Head: Normocephalic and atraumatic.     Mouth/Throat:     Mouth: Mucous membranes are dry.  Cardiovascular:     Rate and Rhythm: Normal rate and regular rhythm.     Pulses: Normal pulses.     Heart sounds: Normal heart sounds.  Pulmonary:     Effort: Pulmonary effort is normal.     Breath sounds: Normal breath  sounds.  Abdominal:     General: Bowel sounds are normal. There is no distension.     Palpations: Abdomen is soft.     Comments: Mildly tender in LLQ. No guarding or rebound tenderness.  Musculoskeletal:     Right lower leg: No edema.     Left lower leg: No edema.  Skin:    General: Skin is warm and dry.     Capillary Refill: Capillary refill takes less than 2 seconds.  Neurological:     Mental Status: She is alert and oriented to person, place, and time.  Psychiatric:        Speech: Speech normal.        Behavior: Behavior is cooperative.    ED Results / Procedures / Treatments   Labs (all labs ordered are listed, but only abnormal results are displayed) Labs Reviewed  CBC WITH DIFFERENTIAL/PLATELET - Abnormal; Notable for the following components:      Result Value   Abs Immature Granulocytes 0.18 (*)    All other components within normal limits  COMPREHENSIVE METABOLIC PANEL - Abnormal; Notable for the following components:   Glucose, Bld 162 (*)    All other components within normal limits  LIPASE, BLOOD  OCCULT BLOOD X 1 CARD TO LAB, STOOL  URINALYSIS, ROUTINE W REFLEX MICROSCOPIC  TROPONIN I (HIGH SENSITIVITY)   EKG EKG Interpretation  Date/Time:  Wednesday January 18 2020 17:23:40 EDT Ventricular Rate:  104 PR Interval:    QRS Duration: 87 QT Interval:  375 QTC Calculation: 494 R Axis:   87 Text Interpretation: Sinus tachycardia Borderline right axis deviation Borderline ST depression, diffuse leads Borderline prolonged QT interval No significant change since last tracing Confirmed by Wandra Arthurs [phone]) on 01/18/2020 5:54:35 PM   Radiology CT ABDOMEN PELVIS W CONTRAST  Result Date: 01/18/2020 CLINICAL DATA:  69 year old female with sudden onset nausea vomiting. Abdominal distension. EXAM: CT ABDOMEN AND PELVIS WITH CONTRAST TECHNIQUE: Multidetector CT imaging of the abdomen and pelvis was performed using the standard protocol following bolus administration of  intravenous contrast. CONTRAST:  158mL OMNIPAQUE IOHEXOL 300 MG/ML  SOLN COMPARISON:  CT Abdomen and Pelvis 09/10/2016. FINDINGS: Lower chest: Negative. Hepatobiliary: Stable small benign central hepatic cyst on series 2, image 9. Otherwise negative liver and gallbladder. Pancreas: Negative. Spleen: Negative. Adrenals/Urinary Tract: Normal adrenal glands. Punctate left lower pole nephrolithiasis. But bilateral renal enhancement and contrast excretion is symmetric and normal. Decompressed proximal ureters. Unremarkable urinary bladder. Several pelvic phleboliths are stable since 2018. Stomach/Bowel: Retained stool in the rectosigmoid colon with no associated large bowel inflammation. Similar retained stool in mildly redundant descending colon.  Much of the transverse colon in all of the ascending colon are completely decompressed. There is diverticulosis of the right colon just upstream of the cecum which as in 2018 is located transversely across the deep pelvis (series 2, image 60) with normal gas containing appendix located to the left of midline on series 2, image 51. No large bowel inflammation identified. Terminal ileum is decompressed and negative in the pelvis. No dilated small bowel. Largely decompressed stomach. Decompressed duodenum. No free air, free fluid, mesenteric stranding. Vascular/Lymphatic: Aortoiliac calcified atherosclerosis. Major arterial structures are patent. Portal venous system is patent. No lymphadenopathy. Reproductive: Negative. Other: No pelvic free fluid Musculoskeletal: Negative, mild for age degenerative osseous changes. IMPRESSION: 1. No acute or inflammatory process identified in the abdomen or pelvis. 2. Punctate left nephrolithiasis. Right colon diverticulosis without active inflammation. Normal appendix. 3. Aortic Atherosclerosis (ICD10-I70.0). Electronically Signed   By: Genevie Ann M.D.   On: 01/18/2020 17:33    Procedures Procedures (including critical care  time)  Medications Ordered in ED Medications  prochlorperazine (COMPAZINE) injection 10 mg (10 mg Intravenous Given 01/18/20 1607)  diphenhydrAMINE (BENADRYL) injection 25 mg (25 mg Intravenous Given 01/18/20 1605)  sodium chloride 0.9 % bolus 1,000 mL (0 mLs Intravenous Stopped 01/18/20 1710)  iohexol (OMNIPAQUE) 300 MG/ML solution 100 mL (100 mLs Intravenous Contrast Given 01/18/20 1707)    ED Course  I have reviewed the triage vital signs and the nursing notes.  Pertinent labs & imaging results that were available during my care of the patient were reviewed by me and considered in my medical decision making (see chart for details).   MDM Rules/Calculators/A&P  Patient is 69yo female with hx of breast cancer presenting to ED via EMS for continued n/v since noon today. Hx of previous syncopal episode 2/2 LGIB. On exam, patient afebrile, HDS, dry mucous membranes, mild LLQ pain. Labs, CT scan pending. Given 1L bolus, compazine for symptom control. Possible GI bleed v infectious causes v atypical ACS.          No leukocytosis, anemia, electrolyte abnormalities. FOBT neg. Trop negx1. CT negative. No overt signs of infection or sources of bleeding. ECG w/o ischemic changes, trop neg. Patient reports improvement of symptoms after dose of compazine. States nausea has decreased and feels like she can eat. Discussed with patient to continue hydrating herself at home and take compazine as needed for nausea. Overall, her symptoms have improved, she is stable, and ready for discharge.                  Final Clinical Impression(s) / ED Diagnoses Final diagnoses:  Nausea and vomiting, intractability of vomiting not specified, unspecified vomiting type  Dehydration   Rx / DC Orders ED Discharge Orders         Ordered    prochlorperazine (COMPAZINE) 10 MG tablet  Every 6 hours PRN     Discontinue  Reprint     01/18/20 Haskell Riling, MD 01/18/20 Kathaleen Maser    Drenda Freeze,  MD 01/18/20 [phone]

## 2020-01-18 NOTE — ED Notes (Signed)
Laying on left side, when moving on stretcher, increases nausea. Dizziness upon moving or turning

## 2020-01-18 NOTE — Discharge Instructions (Addendum)
-  Make sure to drink plenty of liquids like water and Gatorade. It is important to keep yourself hydrated. -Take 1 dose of Compazine every 6 hours as needed for nausea.  -Follow-up with your primary care physician as needed.

## 2020-02-02 ENCOUNTER — Ambulatory Visit
Admission: RE | Admit: 2020-02-02 | Discharge: 2020-02-02 | Disposition: A | Discharge status: Home / Self Care | Payer: Medicare HMO | Source: Ambulatory Visit | Attending: Internal Medicine | Admitting: Internal Medicine

## 2020-02-02 ENCOUNTER — Other Ambulatory Visit: Payer: Self-pay

## 2020-02-02 DIAGNOSIS — R921 Mammographic calcification found on diagnostic imaging of breast: Secondary | ICD-10-CM

## 2020-02-07 NOTE — Progress Notes (Signed)
Patient Care Team: [name], MD as PCP - General (Internal Medicine) Alphonsa Overall, MD as Consulting Physician (General Surgery) Nicholas Lose, MD as Consulting Physician (Hematology and Oncology) Eppie Gibson, MD as Attending Physician (Radiation Oncology)  DIAGNOSIS:    ICD-10-CM   1. Malignant neoplasm of upper-outer quadrant of left breast in female, estrogen receptor positive ([place])  C50.412    Z17.0     SUMMARY OF ONCOLOGIC HISTORY: Oncology History  Malignant neoplasm of upper-outer quadrant of left breast in female, estrogen receptor positive ([place])  02/09/2018 Initial Diagnosis   Screening detected left breast mass UOQ at 12:30 position 9 mm size, axilla negative, ultrasound biopsy revealed grade 2 IDC ER 100%, PR 100%, Ki-67 2%, HER-2 negative IHC 1+, T1b N0 stage Ia AJCC 8   02/18/2018 Genetic Testing   Genetic testing was negative and didn't detect any deleterious mutations.  Variant of Uncertain Significance: MUTYH c.1508G>A (p.Gly503Gl), SDHA c.1919A>G (p.Glu640Gly),SMAD4 c.521C>A (p.Thr174Asn), TERT c.403G>A (p.Gly135Arg). Genes tested include: AIP, ALK, APC, ATM,  AXIN2, BAP1, BARD1, BLM, BMPR1A, BRCA1, BRCA2, BRIP1, CASR, CDC73, CDH1, CDK4, CDKN1B, CDKN1C, CDKN2A (p14ARF), CDKN2A (p16INK4a), CEBPA, CHEK2, CTNNA1, DICER1, DIS3L2, EGFR, EPCAM*, FH , FLCN, GATA2, GPC3, GREM1*, HOXB13, HRAS, KIT, MAX, MEN1, MET, MITF, MLH1, MSH2, MSH3, MSH6, MUTYH, NBN, NF1, NF2, NTHL1, PALB2, PDGFRA, PHOX2B*, PMS2, POLD1, POLE, POT1, PRKAR1A, PTCH1, PTEN, RAD50, RAD51C, RAD51D, RB1, RECQL4, RET, RUNX1, SDHA*, SDHAF2, SDHB, SDHC, SDHD, SMAD4, SMARCA4, SMARCB1,SMARCE1, STK11, SUFU, TERC, TERT, TMEM127,  TP53, TSC1, TSC2, VHL, WRN*, WT1    03/09/2018 Surgery   Left lumpectomy: IDC grade 1, 0.9 cm, intermediate grade DCIS, no lymphovascular or perineural invasion, 0/1 lymph node negative,  ER 100%, PR 100%, Ki-67 2%, HER-2 negative IHC 1+,T1bN0 stage Ia   03/09/2018 Oncotype testing    7/3%   03/17/2018 Cancer Staging   Staging form: Breast, AJCC 8th Edition - Pathologic: Stage IA (pT1b, pN0, cM0, G1, ER+, PR+, HER2-) - Signed by Nicholas Lose, MD on 03/17/2018   04/13/2018 - 05/10/2018 Radiation Therapy   Radiation with Isidore Moos 1. Left Breast / 40.05 Gy in 15 fractions 2. Left Breast Boost / 10 Gy in 5 fractions   05/2018 -  Anti-estrogen oral therapy   Letrozole daily     CHIEF COMPLIANT: Follow-up of left breast cancer on letrozole therapy  INTERVAL HISTORY: Kerry Newman is a 69 y.o. with above-mentioned history of left breast cancer treated with lumpectomy, radiation, and who is currently on anti-estrogen therapy with letrozole. Mammograms on 02/01/19 and 02/02/20 showed stable calcifications in the left breast at the sentinel lymph node biopsy scar. She presents to the clinic today for follow-up.   She has been tolerating letrozole extremely well.  She denies any new problems or concerns.  ALLERGIES:  is allergic to [place] [brimonidine tartrate-timolol].  MEDICATIONS:  Current Outpatient Medications  Medication Sig Dispense Refill  . acetaminophen (TYLENOL) 325 MG tablet Take 650 mg by mouth every 6 (six) hours as needed.    . calcium gluconate 500 MG tablet Take 1 tablet by mouth 3 (three) times daily.    Marland Kitchen letrozole (FEMARA) 2.5 MG tablet Take 1 tablet (2.5 mg total) by mouth daily. 90 tablet 3  . Multiple Vitamin (MULTIVITAMIN WITH MINERALS) TABS Take 1 tablet by mouth daily.    . prochlorperazine (COMPAZINE) 10 MG tablet Take 1 tablet (10 mg total) by mouth every 6 (six) hours as needed for up to 6 doses for nausea or vomiting. 6 tablet 0   No current  facility-administered medications for this visit.    PHYSICAL EXAMINATION: ECOG PERFORMANCE STATUS: 1 - Symptomatic but completely ambulatory  Vitals:   02/08/20 1413  BP: (!) 144/78  Pulse: 84  Resp: 18  Temp: 98.2 F (36.8 C)  SpO2: 99%   Filed Weights   02/08/20 1413  Weight: 154 lb 3.2  oz (69.9 kg)    LABORATORY DATA:  I have reviewed the data as listed CMP Latest Ref Rng & Units 01/18/2020 02/17/2018 09/10/2016  Glucose 70 - 99 mg/dL 162(H) 103(H) 117(H)  BUN 8 - 23 mg/dL _0 Creatinine 0.44 - 1.00 mg/dL 0.85 0.94 0.76  Sodium 135 - 145 mmol/L 138 142 139  Potassium 3.5 - 5.1 mmol/L 3.7 3.9 3.7  Chloride 98 - 111 mmol/L 101 105 104  CO2 22 - 32 mmol/L _1 Calcium 8.9 - 10.3 mg/dL 9.0 9.5 9.0  Total Protein 6.5 - 8.1 g/dL 7.7 8.4(H) 8.0  Total Bilirubin 0.3 - 1.2 mg/dL 1.0 0.9 1.3(H)  Alkaline Phos 38 - 126 U/L 91 118 83  AST 15 - 41 U/L _2 ALT 0 - 44 U/L _3 Lab Results  Component Value Date   WBC 9.2 01/18/2020   HGB 12.0 01/18/2020   HCT 37.0 01/18/2020   MCV 95.4 01/18/2020   PLT 198 01/18/2020   NEUTROABS 6.9 01/18/2020    ASSESSMENT & PLAN:  Malignant neoplasm of upper-outer quadrant of left breast in female, estrogen receptor positive ([place]) 03/09/2018:Left lumpectomy: IDC grade 1, 0.9 cm, intermediate grade DCIS, no lymphovascular or perineural invasion, 0/1 lymph node negative, ER 100%, PR 100%, Ki-67 2%, HER-2 negative IHC 1+,T1bN0 stage Ia Oncotype DX recurrence score 7: 3% RRR at 9 years Adjuvant radiation therapy to be completed 05/05/2018  Treatment plan: Adjuvant antiestrogen therapy with letrozole 2.5 mg daily  Letrozole toxicities: Denies any hot flashes or myalgias. I renewed her prescription for letrozole for another year.  Breast cancer surveillance: Mammogram 02/02/2020: stable calcifications , postlumpectomy changes Stereotactic biopsy 02/03/2019: Benign fat necrosis Breast exam: 02/08/2020: Benign  Return to clinic in 1 year for follow-up    No orders of the defined types were placed in this encounter.  The patient has a good understanding of the overall plan. she agrees with it. she will call with any problems that may develop before the next visit here.  Total time spent: 30 mins including face  to face time and time spent for planning, charting and coordination of care  Nicholas Lose, MD 02/08/2020  I, Cloyde Reams Dorshimer, am acting as scribe for Dr. Nicholas Lose.  I have reviewed the above documentation for accuracy and completeness, and I agree with the above.

## 2020-02-08 ENCOUNTER — Inpatient Hospital Stay: Payer: Medicare HMO | Attending: Hematology and Oncology | Admitting: Hematology and Oncology

## 2020-02-08 ENCOUNTER — Telehealth: Payer: Self-pay | Admitting: Hematology and Oncology

## 2020-02-08 ENCOUNTER — Other Ambulatory Visit: Payer: Self-pay

## 2020-02-08 DIAGNOSIS — Z923 Personal history of irradiation: Secondary | ICD-10-CM | POA: Insufficient documentation

## 2020-02-08 DIAGNOSIS — Z79811 Long term (current) use of aromatase inhibitors: Secondary | ICD-10-CM | POA: Insufficient documentation

## 2020-02-08 DIAGNOSIS — Z170 Estrogen receptor positive status [ER+]: Secondary | ICD-10-CM | POA: Insufficient documentation

## 2020-02-08 DIAGNOSIS — C50412 Malignant neoplasm of upper-outer quadrant of left female breast: Secondary | ICD-10-CM | POA: Insufficient documentation

## 2020-02-08 MED ORDER — CALCIUM CITRATE-VITAMIN D 250-100 MG-UNIT PO TABS: 1.0000 | ORAL_TABLET | Freq: Two times a day (BID) | ORAL | Status: AC

## 2020-02-08 MED ORDER — LETROZOLE 2.5 MG PO TABS: 2.5000 mg | ORAL_TABLET | Freq: Every day | ORAL | 3 refills | Status: DC

## 2020-02-08 MED ORDER — MULTIVITAMINS PO CAPS: 1.0000 | ORAL_CAPSULE | Freq: Every day | ORAL | Status: AC

## 2020-02-08 MED ORDER — LATANOPROST 0.005 % OP SOLN
1.0000 [drp] | Freq: Every day | OPHTHALMIC | 12 refills | Status: AC
Start: 2020-02-08 — End: ?

## 2020-02-08 MED ORDER — ATORVASTATIN CALCIUM 20 MG PO TABS
20.0000 mg | ORAL_TABLET | Freq: Every day | ORAL | Status: AC
Start: 2020-02-08 — End: ?

## 2020-02-08 NOTE — Telephone Encounter (Signed)
Scheduled appts per 8/18 los. Pt confirmed appt date and time.

## 2020-02-08 NOTE — Assessment & Plan Note (Signed)
03/09/2018:Left lumpectomy: IDC grade 1, 0.9 cm, intermediate grade DCIS, no lymphovascular or perineural invasion, 0/1 lymph node negative, ER 100%, PR 100%, Ki-67 2%, HER-2 negative IHC 1+,T1bN0 stage Ia Oncotype DX recurrence score 7: 3% RRR at 9 years Adjuvant radiation therapy to be completed 05/05/2018  Treatment plan: Adjuvant antiestrogen therapy with letrozole 2.5 mg daily  Letrozole toxicities: Denies any hot flashes or myalgias. I renewed her prescription for letrozole for another year.  Breast cancer surveillance: Mammogram 02/02/2020: stable calcifications , postlumpectomy changes Stereotactic biopsy 02/03/2019: Benign fat necrosis Breast exam: 02/08/2020: Benign  Return to clinic in 1 year for follow-up

## 2020-04-07 ENCOUNTER — Ambulatory Visit: Payer: Medicare HMO | Attending: Internal Medicine

## 2020-04-07 ENCOUNTER — Other Ambulatory Visit: Payer: Self-pay

## 2020-04-07 DIAGNOSIS — Z23 Encounter for immunization: Secondary | ICD-10-CM

## 2020-04-07 NOTE — Progress Notes (Signed)
   Covid-19 Vaccination Clinic  Name:  Maycie Luera    MRN: [id number] DOB: [birth year]  04/07/2020  Ms. Noyes was observed post Covid-19 immunization for 15 minutes without incident. She was provided with Vaccine Information Sheet and instruction to access the V-Safe system.   Ms. Longnecker was instructed to call 911 with any severe reactions post vaccine: Marland Kitchen Difficulty breathing  . Swelling of face and throat  . A fast heartbeat  . A bad rash all over body  . Dizziness and weakness

## 2020-07-25 ENCOUNTER — Encounter: Payer: Self-pay | Admitting: Genetic Counselor

## 2020-11-21 ENCOUNTER — Other Ambulatory Visit: Payer: Self-pay

## 2020-11-21 ENCOUNTER — Other Ambulatory Visit (HOSPITAL_BASED_OUTPATIENT_CLINIC_OR_DEPARTMENT_OTHER): Payer: Self-pay

## 2020-11-21 ENCOUNTER — Ambulatory Visit: Payer: Medicare HMO | Attending: Internal Medicine

## 2020-11-21 DIAGNOSIS — Z23 Encounter for immunization: Secondary | ICD-10-CM

## 2020-11-21 MED ORDER — PFIZER-BIONT COVID-19 VAC-TRIS 30 MCG/0.3ML IM SUSP
INTRAMUSCULAR | 0 refills | Status: AC
  Filled 2020-11-21: qty 0.3, 1d supply, fill #0

## 2020-11-21 NOTE — Progress Notes (Signed)
   Covid-19 Vaccination Clinic  Name:  Kerry Newman    MRN: [id number] DOB: 12/06/1950  11/21/2020  [name] was observed post Covid-19 immunization for 15 minutes without incident. She was provided with Vaccine Information Sheet and instruction to access the V-Safe system.   [name] was instructed to call 911 with any severe reactions post vaccine: Marland Kitchen Difficulty breathing  . Swelling of face and throat  . A fast heartbeat  . A bad rash all over body  . Dizziness and weakness   Immunizations Administered    Name Date Dose VIS Date Route   PFIZER Comrnaty(Gray TOP) Covid-19 Vaccine 11/21/2020 10:42 AM 0.3 mL 05/31/2020 Intramuscular   Manufacturer: [employer]   Lot: [id number]   [place]: [phone]

## 2020-12-13 ENCOUNTER — Other Ambulatory Visit: Payer: Self-pay | Admitting: Internal Medicine

## 2020-12-13 DIAGNOSIS — Z9889 Other specified postprocedural states: Secondary | ICD-10-CM

## 2021-01-30 ENCOUNTER — Other Ambulatory Visit: Payer: Self-pay | Admitting: Internal Medicine

## 2021-01-30 DIAGNOSIS — Z853 Personal history of malignant neoplasm of breast: Secondary | ICD-10-CM

## 2021-02-01 ENCOUNTER — Other Ambulatory Visit: Payer: Self-pay | Admitting: Adult Health

## 2021-02-01 DIAGNOSIS — Z853 Personal history of malignant neoplasm of breast: Secondary | ICD-10-CM

## 2021-02-04 ENCOUNTER — Ambulatory Visit
Admission: RE | Admit: 2021-02-04 | Discharge: 2021-02-04 | Disposition: A | Discharge status: Home / Self Care | Payer: Medicare HMO | Source: Ambulatory Visit | Attending: Internal Medicine | Admitting: Internal Medicine

## 2021-02-04 ENCOUNTER — Other Ambulatory Visit: Payer: Self-pay

## 2021-02-04 DIAGNOSIS — Z853 Personal history of malignant neoplasm of breast: Secondary | ICD-10-CM

## 2021-02-06 NOTE — Progress Notes (Signed)
Patient Care Team: Fleet Contras, MD as PCP - General (Internal Medicine) Ovidio Kin, MD as Consulting Physician (General Surgery) Serena Croissant, MD as Consulting Physician (Hematology and Oncology) Lonie Peak, MD as Attending Physician (Radiation Oncology)  DIAGNOSIS:    ICD-10-CM   1. Malignant neoplasm of upper-outer quadrant of left breast in female, estrogen receptor positive ([facility])  C50.412    Z17.0       SUMMARY OF ONCOLOGIC HISTORY: Oncology History  Malignant neoplasm of upper-outer quadrant of left breast in female, estrogen receptor positive ([facility])  02/09/2018 Initial Diagnosis   Screening detected left breast mass UOQ at 12:30 position 9 mm size, axilla negative, ultrasound biopsy revealed grade 2 IDC ER 100%, PR 100%, Ki-67 2%, HER-2 negative IHC 1+, T1b N0 stage Ia AJCC 8   02/18/2018 Genetic Testing   Genetic testing was negative and didn't detect any deleterious mutations.  Variant of Uncertain Significance: MUTYH c.1508G>A (p.Gly503Gl), SDHA c.1919A>G (p.Glu640Gly),SMAD4 c.521C>A (p.Thr174Asn), TERT c.403G>A (p.Gly135Arg). Genes tested include: AIP, ALK, APC, ATM,  AXIN2, BAP1, BARD1, BLM, BMPR1A, BRCA1, BRCA2, BRIP1, CASR, CDC73, CDH1, CDK4, CDKN1B, CDKN1C, CDKN2A (p14ARF), CDKN2A (p16INK4a), CEBPA, CHEK2, CTNNA1, DICER1, DIS3L2, EGFR, EPCAM*, FH , FLCN, GATA2, GPC3, GREM1*, HOXB13, HRAS, KIT, MAX, MEN1, MET, MITF, MLH1, MSH2, MSH3, MSH6, MUTYH, NBN, NF1, NF2, NTHL1, PALB2, PDGFRA, PHOX2B*, PMS2, POLD1, POLE, POT1, PRKAR1A, PTCH1, PTEN, RAD50, RAD51C, RAD51D, RB1, RECQL4, RET, RUNX1, SDHA*, SDHAF2, SDHB, SDHC, SDHD, SMAD4, SMARCA4, SMARCB1,SMARCE1, STK11, SUFU, TERC, TERT, TMEM127,  TP53, TSC1, TSC2, VHL, WRN*, WT1    03/09/2018 Surgery   Left lumpectomy: IDC grade 1, 0.9 cm, intermediate grade DCIS, no lymphovascular or perineural invasion, 0/1 lymph node negative,  ER 100%, PR 100%, Ki-67 2%, HER-2 negative IHC 1+,T1bN0 stage Ia   03/09/2018 Oncotype testing    7/3%   03/17/2018 Cancer Staging   Staging form: Breast, AJCC 8th Edition - Pathologic: Stage IA (pT1b, pN0, cM0, G1, ER+, PR+, HER2-) - Signed by Serena Croissant, MD on 03/17/2018   04/13/2018 - 05/10/2018 Radiation Therapy   Radiation with Basilio Cairo 1. Left Breast / 40.05 Gy in 15 fractions 2. Left Breast Boost / 10 Gy in 5 fractions   05/2018 -  Anti-estrogen oral therapy   Letrozole daily     CHIEF COMPLIANT: Follow-up of left breast cancer on letrozole therapy  INTERVAL HISTORY: Kerry Newman is a 70 y.o. with above-mentioned history of left breast cancer treated with lumpectomy, radiation, and who is currently on anti-estrogen therapy with letrozole. Mammogram on 02/04/21 showed no evidence of malignancy bilaterally. She presents to the clinic today for follow-up.  Her major complaint today is chronic right upper quadrant pain.  She is previously seen by Dr. Ezzard Standing.  She would like to see a surgeon to see if it can be removed. She does me that she is planning on taking an extended vacation from her husband and she plans to go to [place] to stay with her sister.  They are having marital issues and she thinks that he is developing dementia.  ALLERGIES:  is allergic to [place] [brimonidine tartrate-timolol].  MEDICATIONS:  Current Outpatient Medications  Medication Sig Dispense Refill   acetaminophen (TYLENOL) 325 MG tablet Take 650 mg by mouth every 6 (six) hours as needed.     atorvastatin (LIPITOR) 20 MG tablet Take 1 tablet (20 mg total) by mouth daily.     calcium gluconate 500 MG tablet Take 1 tablet by mouth 3 (three) times daily.     calcium-vitamin D  250-100 MG-UNIT tablet Take 1 tablet by mouth 2 (two) times daily.     COVID-19 mRNA Vac-TriS, Pfizer, (PFIZER-BIONT COVID-19 VAC-TRIS) SUSP injection Inject into the muscle. 0.3 mL 0   latanoprost (XALATAN) 0.005 % ophthalmic solution Place 1 drop into both eyes at bedtime. 2.5 mL 12   letrozole (FEMARA) 2.5 MG tablet Take 1  tablet (2.5 mg total) by mouth daily. 90 tablet 3   Multiple Vitamin (MULTIVITAMIN WITH MINERALS) TABS Take 1 tablet by mouth daily.     Multiple Vitamin (MULTIVITAMIN) capsule Take 1 capsule by mouth daily.     prochlorperazine (COMPAZINE) 10 MG tablet Take 1 tablet (10 mg total) by mouth every 6 (six) hours as needed for up to 6 doses for nausea or vomiting. 6 tablet 0   No current facility-administered medications for this visit.    PHYSICAL EXAMINATION: ECOG PERFORMANCE STATUS: 1 - Symptomatic but completely ambulatory  Vitals:   02/07/21 1349  BP: (!) 127/93  Pulse: 77  Resp: 18  Temp: 98 F (36.7 C)  SpO2: 100%   Filed Weights   02/07/21 1349  Weight: 148 lb 8 oz (67.4 kg)    BREAST: No palpable masses or nodules in either right or left breasts. No palpable axillary supraclavicular or infraclavicular adenopathy no breast tenderness or nipple discharge. (exam performed in the presence of a chaperone)  LABORATORY DATA:  I have reviewed the data as listed CMP Latest Ref Rng & Units 01/18/2020 02/17/2018 09/10/2016  Glucose 70 - 99 mg/dL 162(H) 103(H) 117(H)  BUN 8 - 23 mg/dL $Remove'14 9 13  'xueYVgS$ Creatinine 0.44 - 1.00 mg/dL 0.85 0.94 0.76  Sodium 135 - 145 mmol/L 138 142 139  Potassium 3.5 - 5.1 mmol/L 3.7 3.9 3.7  Chloride 98 - 111 mmol/L 101 105 104  CO2 22 - 32 mmol/L $RemoveB'25 30 27  'RaWBaPIv$ Calcium 8.9 - 10.3 mg/dL 9.0 9.5 9.0  Total Protein 6.5 - 8.1 g/dL 7.7 8.4(H) 8.0  Total Bilirubin 0.3 - 1.2 mg/dL 1.0 0.9 1.3(H)  Alkaline Phos 38 - 126 U/L 91 118 83  AST 15 - 41 U/L $Remo'25 23 24  'dvGAr$ ALT 0 - 44 U/L $Remo'25 23 22    'CmflV$ Lab Results  Component Value Date   WBC 9.2 01/18/2020   HGB 12.0 01/18/2020   HCT 37.0 01/18/2020   MCV 95.4 01/18/2020   PLT 198 01/18/2020   NEUTROABS 6.9 01/18/2020    ASSESSMENT & PLAN:  Malignant neoplasm of upper-outer quadrant of left breast in female, estrogen receptor positive ([place]) 03/09/2018: Left lumpectomy: IDC grade 1, 0.9 cm, intermediate grade DCIS, no  lymphovascular or perineural invasion, 0/1 lymph node negative,  ER 100%, PR 100%, Ki-67 2%, HER-2 negative IHC 1+,T1bN0 stage Ia Oncotype DX recurrence score 7: 3% RRR at 9 years Adjuvant radiation therapy to be completed 05/05/2018   Treatment plan: Adjuvant antiestrogen therapy with letrozole 2.5 mg daily   Letrozole toxicities: Denies any hot flashes or myalgias. I renewed her prescription for letrozole for another year.   Breast cancer surveillance:  Mammogram  02/04/2021:  Benign breast density category C Stereotactic biopsy 02/03/2019: Benign fat necrosis Breast exam: 02/07/2021: Benign   Right upper quadrant abdominal pain with gallbladder polyps: I will send referral to Dr. Donne Hazel for surgical evaluation.  Return to clinic in 1 year for follow-up    No orders of the defined types were placed in this encounter.  The patient has a good understanding of the overall plan. she agrees with it. she  will call with any problems that may develop before the next visit here.  Total time spent: 20 mins including face to face time and time spent for planning, charting and coordination of care  Rulon Eisenmenger, MD, MPH 02/07/2021  I, Thana Ates, am acting as scribe for Dr. Nicholas Lose.  I have reviewed the above documentation for accuracy and completeness, and I agree with the above.

## 2021-02-07 ENCOUNTER — Inpatient Hospital Stay: Payer: Medicare HMO | Attending: Hematology and Oncology | Admitting: Hematology and Oncology

## 2021-02-07 ENCOUNTER — Telehealth: Payer: Self-pay

## 2021-02-07 ENCOUNTER — Other Ambulatory Visit: Payer: Self-pay

## 2021-02-07 DIAGNOSIS — Z923 Personal history of irradiation: Secondary | ICD-10-CM | POA: Insufficient documentation

## 2021-02-07 DIAGNOSIS — Z79811 Long term (current) use of aromatase inhibitors: Secondary | ICD-10-CM | POA: Diagnosis not present

## 2021-02-07 DIAGNOSIS — Z170 Estrogen receptor positive status [ER+]: Secondary | ICD-10-CM | POA: Diagnosis not present

## 2021-02-07 DIAGNOSIS — Z79899 Other long term (current) drug therapy: Secondary | ICD-10-CM | POA: Insufficient documentation

## 2021-02-07 DIAGNOSIS — C50412 Malignant neoplasm of upper-outer quadrant of left female breast: Secondary | ICD-10-CM

## 2021-02-07 DIAGNOSIS — F0390 Unspecified dementia without behavioral disturbance: Secondary | ICD-10-CM | POA: Diagnosis not present

## 2021-02-07 DIAGNOSIS — R1011 Right upper quadrant pain: Secondary | ICD-10-CM | POA: Insufficient documentation

## 2021-02-07 NOTE — Assessment & Plan Note (Signed)
03/09/2018:Left lumpectomy: IDC grade 1, 0.9 cm, intermediate grade DCIS, no lymphovascular or perineural invasion, 0/1 lymph node negative, ER 100%, PR 100%, Ki-67 2%, HER-2 negative IHC 1+,T1bN0 stage Ia Oncotype DX recurrence score 7: 3% RRR at 9 years Adjuvant radiation therapy to be completed 05/05/2018  Treatment plan:Adjuvant antiestrogen therapy with letrozole 2.5 mg daily  Letrozoletoxicities:Denies any hot flashes or myalgias. I renewed her prescription for letrozole for another year.  Breast cancer surveillance:  Mammogram  02/04/2021:  Benign breast density category C Stereotactic biopsy 02/03/2019:Benign fat necrosis Breast exam: 02/07/2021: Benign  Return to clinic in 1 year for follow-up

## 2021-02-07 NOTE — Telephone Encounter (Signed)
Attempted to call pt regarding gallbladder US. No records found in Epic of [place]. LVM for pt to return call to give [place] info about who performed US.

## 2021-02-11 NOTE — Telephone Encounter (Signed)
Error

## 2022-01-03 ENCOUNTER — Other Ambulatory Visit: Payer: Self-pay | Admitting: Adult Health

## 2022-01-03 DIAGNOSIS — Z9889 Other specified postprocedural states: Secondary | ICD-10-CM

## 2022-02-05 ENCOUNTER — Ambulatory Visit
Admission: RE | Admit: 2022-02-05 | Discharge: 2022-02-05 | Disposition: A | Discharge status: Home / Self Care | Payer: Medicare HMO | Source: Ambulatory Visit | Attending: Adult Health | Admitting: Adult Health

## 2022-02-05 DIAGNOSIS — Z853 Personal history of malignant neoplasm of breast: Secondary | ICD-10-CM | POA: Diagnosis not present

## 2022-02-05 DIAGNOSIS — Z9889 Other specified postprocedural states: Secondary | ICD-10-CM

## 2022-02-06 DIAGNOSIS — Z131 Encounter for screening for diabetes mellitus: Secondary | ICD-10-CM | POA: Diagnosis not present

## 2022-02-06 DIAGNOSIS — K219 Gastro-esophageal reflux disease without esophagitis: Secondary | ICD-10-CM | POA: Diagnosis not present

## 2022-02-06 DIAGNOSIS — D493 Neoplasm of unspecified behavior of breast: Secondary | ICD-10-CM | POA: Diagnosis not present

## 2022-02-06 DIAGNOSIS — M169 Osteoarthritis of hip, unspecified: Secondary | ICD-10-CM | POA: Diagnosis not present

## 2022-02-06 DIAGNOSIS — E559 Vitamin D deficiency, unspecified: Secondary | ICD-10-CM | POA: Diagnosis not present

## 2022-02-06 DIAGNOSIS — Z0000 Encounter for general adult medical examination without abnormal findings: Secondary | ICD-10-CM | POA: Diagnosis not present

## 2022-02-06 DIAGNOSIS — E7849 Other hyperlipidemia: Secondary | ICD-10-CM | POA: Diagnosis not present

## 2022-02-06 DIAGNOSIS — F430 Acute stress reaction: Secondary | ICD-10-CM | POA: Diagnosis not present

## 2022-02-06 NOTE — Progress Notes (Signed)
Patient Care Team: [name], MD as PCP - General (Internal Medicine) Alphonsa Overall, MD as Consulting Physician (General Surgery) Nicholas Lose, MD as Consulting Physician (Hematology and Oncology) Eppie Gibson, MD as Attending Physician (Radiation Oncology)  DIAGNOSIS:  Encounter Diagnosis  Name Primary?   Malignant neoplasm of upper-outer quadrant of left breast in female, estrogen receptor positive ([place])     SUMMARY OF ONCOLOGIC HISTORY: Oncology History  Malignant neoplasm of upper-outer quadrant of left breast in female, estrogen receptor positive ([place])  02/09/2018 Initial Diagnosis   Screening detected left breast mass UOQ at 12:30 position 9 mm size, axilla negative, ultrasound biopsy revealed grade 2 IDC ER 100%, PR 100%, Ki-67 2%, HER-2 negative IHC 1+, T1b N0 stage Ia AJCC 8   02/18/2018 Genetic Testing   Genetic testing was negative and didn't detect any deleterious mutations.  Variant of Uncertain Significance: MUTYH c.1508G>A (p.Gly503Gl), SDHA c.1919A>G (p.Glu640Gly),SMAD4 c.521C>A (p.Thr174Asn), TERT c.403G>A (p.Gly135Arg). Genes tested include: AIP, ALK, APC, ATM,  AXIN2, BAP1, BARD1, BLM, BMPR1A, BRCA1, BRCA2, BRIP1, CASR, CDC73, CDH1, CDK4, CDKN1B, CDKN1C, CDKN2A (p14ARF), CDKN2A (p16INK4a), CEBPA, CHEK2, CTNNA1, DICER1, DIS3L2, EGFR, EPCAM*, FH , FLCN, GATA2, GPC3, GREM1*, HOXB13, HRAS, KIT, MAX, MEN1, MET, MITF, MLH1, MSH2, MSH3, MSH6, MUTYH, NBN, NF1, NF2, NTHL1, PALB2, PDGFRA, PHOX2B*, PMS2, POLD1, POLE, POT1, PRKAR1A, PTCH1, PTEN, RAD50, RAD51C, RAD51D, RB1, RECQL4, RET, RUNX1, SDHA*, SDHAF2, SDHB, SDHC, SDHD, SMAD4, SMARCA4, SMARCB1,SMARCE1, STK11, SUFU, TERC, TERT, TMEM127,  TP53, TSC1, TSC2, VHL, WRN*, WT1    03/09/2018 Surgery   Left lumpectomy: IDC grade 1, 0.9 cm, intermediate grade DCIS, no lymphovascular or perineural invasion, 0/1 lymph node negative,  ER 100%, PR 100%, Ki-67 2%, HER-2 negative IHC 1+,T1bN0 stage Ia   03/09/2018 Oncotype testing    7/3%   03/17/2018 Cancer Staging   Staging form: Breast, AJCC 8th Edition - Pathologic: Stage IA (pT1b, pN0, cM0, G1, ER+, PR+, HER2-) - Signed by Nicholas Lose, MD on 03/17/2018   04/13/2018 - 05/10/2018 Radiation Therapy   Radiation with Isidore Moos 1. Left Breast / 40.05 Gy in 15 fractions 2. Left Breast Boost / 10 Gy in 5 fractions   05/2018 -  Anti-estrogen oral therapy   Letrozole daily     CHIEF COMPLIANT: Follow-up breast cancer surveillance on letrozole  INTERVAL HISTORY: Kerry Newman is a  71 y.o. with above-mentioned history of left breast cancer treated with lumpectomy, radiation, and who is currently on anti-estrogen therapy with letrozole. She presents to the clinic today for a follow-up. She states that she is tolerating the letrozole. Denies pain and discomfort.   ALLERGIES:  is allergic to [place] [brimonidine tartrate-timolol].  MEDICATIONS:  Current Outpatient Medications  Medication Sig Dispense Refill   acetaminophen (TYLENOL) 325 MG tablet Take 650 mg by mouth every 6 (six) hours as needed.     atorvastatin (LIPITOR) 20 MG tablet Take 1 tablet (20 mg total) by mouth daily.     calcium gluconate 500 MG tablet Take 1 tablet by mouth 3 (three) times daily.     calcium-vitamin D 250-100 MG-UNIT tablet Take 1 tablet by mouth 2 (two) times daily.     COVID-19 mRNA Vac-TriS, Pfizer, (PFIZER-BIONT COVID-19 VAC-TRIS) SUSP injection Inject into the muscle. 0.3 mL 0   latanoprost (XALATAN) 0.005 % ophthalmic solution Place 1 drop into both eyes at bedtime. 2.5 mL 12   letrozole (FEMARA) 2.5 MG tablet Take 1 tablet (2.5 mg total) by mouth daily. 90 tablet 3   Multiple Vitamin (MULTIVITAMIN WITH MINERALS) TABS  Take 1 tablet by mouth daily.     Multiple Vitamin (MULTIVITAMIN) capsule Take 1 capsule by mouth daily.     prochlorperazine (COMPAZINE) 10 MG tablet Take 1 tablet (10 mg total) by mouth every 6 (six) hours as needed for up to 6 doses for nausea or vomiting. 6  tablet 0   No current facility-administered medications for this visit.    PHYSICAL EXAMINATION: ECOG PERFORMANCE STATUS: 1 - Symptomatic but completely ambulatory  Vitals:   02/10/22 1525  BP: (!) 151/85  Pulse: 86  Resp: 18  Temp: 97.9 F (36.6 C)  SpO2: 99%   Filed Weights   02/10/22 1525  Weight: 150 lb 12.8 oz (68.4 kg)    BREAST: No palpable masses or nodules in either right or left breasts. No palpable axillary supraclavicular or infraclavicular adenopathy no breast tenderness or nipple discharge. (exam performed in the presence of a chaperone)  LABORATORY DATA:  I have reviewed the data as listed    Latest Ref Rng & Units 01/18/2020    3:55 PM 02/17/2018    8:36 AM 09/10/2016   10:50 AM  CMP  Glucose 70 - 99 mg/dL 162  103  117   BUN 8 - 23 mg/dL _0 Creatinine 0.44 - 1.00 mg/dL 0.85  0.94  0.76   Sodium 135 - 145 mmol/L 138  142  139   Potassium 3.5 - 5.1 mmol/L 3.7  3.9  3.7   Chloride 98 - 111 mmol/L 101  105  104   CO2 22 - 32 mmol/L _1 Calcium 8.9 - 10.3 mg/dL 9.0  9.5  9.0   Total Protein 6.5 - 8.1 g/dL 7.7  8.4  8.0   Total Bilirubin 0.3 - 1.2 mg/dL 1.0  0.9  1.3   Alkaline Phos 38 - 126 U/L 91  118  83   AST 15 - 41 U/L _2 ALT 0 - 44 U/L _3 Lab Results  Component Value Date   WBC 9.2 01/18/2020   HGB 12.0 01/18/2020   HCT 37.0 01/18/2020   MCV 95.4 01/18/2020   PLT 198 01/18/2020   NEUTROABS 6.9 01/18/2020    ASSESSMENT & PLAN:  Malignant neoplasm of upper-outer quadrant of left breast in female, estrogen receptor positive ([place]) 03/09/2018: Left lumpectomy: IDC grade 1, 0.9 cm, intermediate grade DCIS, no lymphovascular or perineural invasion, 0/1 lymph node negative,  ER 100%, PR 100%, Ki-67 2%, HER-2 negative IHC 1+,T1bN0 stage Ia Oncotype DX recurrence score 7: 3% RRR at 9 years Adjuvant radiation therapy to be completed 05/05/2018   Treatment plan: Adjuvant antiestrogen therapy with letrozole 2.5 mg  daily   Letrozole toxicities: Denies any hot flashes or myalgias. We can discuss next year if she wants to continue for 7 years of therapy versus stopping it 5.   Breast cancer surveillance:  Mammogram 02/05/2022:  Benign breast density category C Stereotactic biopsy 02/03/2019: Benign fat necrosis Breast exam: 02/10/2022: Benign       Return to clinic in 1 year for follow-up    No orders of the defined types were placed in this encounter.  The patient has a good understanding of the overall plan. she agrees with it. she will call with any problems that may develop before the next visit here. Total time spent: 30 mins including face to face time and time spent for  planning, charting and co-ordination of care   Harriette Ohara, MD 02/10/22    I Gardiner Coins am scribing for Dr. Lindi Adie  I have reviewed the above documentation for accuracy and completeness, and I agree with the above.

## 2022-02-10 ENCOUNTER — Inpatient Hospital Stay: Payer: Medicare HMO | Attending: Hematology and Oncology | Admitting: Hematology and Oncology

## 2022-02-10 ENCOUNTER — Other Ambulatory Visit: Payer: Self-pay

## 2022-02-10 DIAGNOSIS — Z79899 Other long term (current) drug therapy: Secondary | ICD-10-CM | POA: Insufficient documentation

## 2022-02-10 DIAGNOSIS — Z79811 Long term (current) use of aromatase inhibitors: Secondary | ICD-10-CM | POA: Insufficient documentation

## 2022-02-10 DIAGNOSIS — Z170 Estrogen receptor positive status [ER+]: Secondary | ICD-10-CM | POA: Diagnosis not present

## 2022-02-10 DIAGNOSIS — C50412 Malignant neoplasm of upper-outer quadrant of left female breast: Secondary | ICD-10-CM | POA: Insufficient documentation

## 2022-02-10 NOTE — Assessment & Plan Note (Addendum)
03/09/2018:Left lumpectomy: IDC grade 1, 0.9 cm, intermediate grade DCIS, no lymphovascular or perineural invasion, 0/1 lymph node negative, ER 100%, PR 100%, Ki-67 2%, HER-2 negative IHC 1+,T1bN0 stage Ia Oncotype DX recurrence score 7: 3% RRR at 9 years Adjuvant radiation therapy to be completed 05/05/2018  Treatment plan:Adjuvant antiestrogen therapy with letrozole 2.5 mg daily  Letrozoletoxicities:Denies any hot flashes or myalgias. We can discuss next year if she wants to continue for 7 years of therapy versus stopping it 5.  Breast cancer surveillance:  Mammogram8/16/2023: Benign breast density category C Stereotactic biopsy 02/03/2019:Benign fat necrosis Breast exam: 02/10/2022: Benign     Return to clinic in 1 year for follow-up

## 2022-02-12 ENCOUNTER — Telehealth: Payer: Self-pay | Admitting: Hematology and Oncology

## 2022-02-12 NOTE — Telephone Encounter (Signed)
Scheduled appointment per 8/21 los. Left voicemail.

## 2022-02-14 DIAGNOSIS — K824 Cholesterolosis of gallbladder: Secondary | ICD-10-CM | POA: Diagnosis not present

## 2022-02-14 DIAGNOSIS — K7689 Other specified diseases of liver: Secondary | ICD-10-CM | POA: Diagnosis not present

## 2022-02-25 DIAGNOSIS — Z23 Encounter for immunization: Secondary | ICD-10-CM | POA: Diagnosis not present

## 2022-04-24 DIAGNOSIS — H409 Unspecified glaucoma: Secondary | ICD-10-CM | POA: Diagnosis not present

## 2022-04-24 DIAGNOSIS — H538 Other visual disturbances: Secondary | ICD-10-CM | POA: Diagnosis not present

## 2022-04-29 DIAGNOSIS — H5213 Myopia, bilateral: Secondary | ICD-10-CM | POA: Diagnosis not present

## 2022-04-29 DIAGNOSIS — H52209 Unspecified astigmatism, unspecified eye: Secondary | ICD-10-CM | POA: Diagnosis not present

## 2022-04-29 DIAGNOSIS — H524 Presbyopia: Secondary | ICD-10-CM | POA: Diagnosis not present

## 2022-05-13 DIAGNOSIS — E7849 Other hyperlipidemia: Secondary | ICD-10-CM | POA: Diagnosis not present

## 2022-05-13 DIAGNOSIS — D493 Neoplasm of unspecified behavior of breast: Secondary | ICD-10-CM | POA: Diagnosis not present

## 2022-05-13 DIAGNOSIS — F430 Acute stress reaction: Secondary | ICD-10-CM | POA: Diagnosis not present

## 2022-05-13 DIAGNOSIS — K219 Gastro-esophageal reflux disease without esophagitis: Secondary | ICD-10-CM | POA: Diagnosis not present

## 2022-11-21 ENCOUNTER — Other Ambulatory Visit: Payer: Self-pay | Admitting: Adult Health

## 2022-11-21 ENCOUNTER — Encounter: Payer: Self-pay | Admitting: Adult Health

## 2022-11-21 DIAGNOSIS — Z9889 Other specified postprocedural states: Secondary | ICD-10-CM

## 2023-01-27 ENCOUNTER — Other Ambulatory Visit: Payer: Self-pay | Admitting: Internal Medicine

## 2023-01-28 ENCOUNTER — Other Ambulatory Visit: Payer: Self-pay | Admitting: Internal Medicine

## 2023-01-28 DIAGNOSIS — E559 Vitamin D deficiency, unspecified: Secondary | ICD-10-CM

## 2023-01-28 DIAGNOSIS — E213 Hyperparathyroidism, unspecified: Secondary | ICD-10-CM

## 2023-02-11 ENCOUNTER — Telehealth: Payer: Self-pay

## 2023-02-11 ENCOUNTER — Inpatient Hospital Stay: Payer: No Typology Code available for payment source | Admitting: Hematology and Oncology

## 2023-02-11 NOTE — Telephone Encounter (Signed)
Called pt to r/s MD appt for after her MM she is scheduled for dx MM 8/28 and wi;; have MD f/u 9/5 at [facility]

## 2023-02-11 NOTE — Assessment & Plan Note (Deleted)
03/09/2018: Left lumpectomy: IDC grade 1, 0.9 cm, intermediate grade DCIS, no lymphovascular or perineural invasion, 0/1 lymph node negative,  ER 100%, PR 100%, Ki-67 2%, HER-2 negative IHC 1+,T1bN0 stage Ia Oncotype DX recurrence score 7: 3% RRR at 9 years Adjuvant radiation therapy to be completed 05/05/2018   Treatment plan: Adjuvant antiestrogen therapy with letrozole 2.5 mg daily   Letrozole toxicities: Denies any hot flashes or myalgias. We can discuss next year if she wants to continue for 7 years of therapy versus stopping it 5.   Breast cancer surveillance:  Mammogram 02/05/2022:  Benign breast density category C, next mammogram scheduled for 02/18/2023 Stereotactic biopsy 02/03/2019: Benign fat necrosis Breast exam: 02/11/2023: Benign   Return to clinic in 1 year for follow-up

## 2023-02-18 ENCOUNTER — Ambulatory Visit
Admission: RE | Admit: 2023-02-18 | Discharge: 2023-02-18 | Disposition: A | Discharge status: Home / Self Care | Payer: No Typology Code available for payment source | Source: Ambulatory Visit | Attending: Adult Health | Admitting: Adult Health

## 2023-02-18 DIAGNOSIS — Z9889 Other specified postprocedural states: Secondary | ICD-10-CM

## 2023-02-26 ENCOUNTER — Inpatient Hospital Stay
Payer: No Typology Code available for payment source | Attending: Hematology and Oncology | Admitting: Hematology and Oncology

## 2023-02-26 VITALS — BP 161/70 | HR 71 | Temp 97.3°F | Resp 18 | Ht 68.5 in | Wt 148.1 lb

## 2023-02-26 DIAGNOSIS — Z170 Estrogen receptor positive status [ER+]: Secondary | ICD-10-CM | POA: Diagnosis present

## 2023-02-26 DIAGNOSIS — Z79811 Long term (current) use of aromatase inhibitors: Secondary | ICD-10-CM | POA: Insufficient documentation

## 2023-02-26 DIAGNOSIS — Z923 Personal history of irradiation: Secondary | ICD-10-CM | POA: Insufficient documentation

## 2023-02-26 DIAGNOSIS — Z79899 Other long term (current) drug therapy: Secondary | ICD-10-CM | POA: Diagnosis not present

## 2023-02-26 DIAGNOSIS — C50412 Malignant neoplasm of upper-outer quadrant of left female breast: Secondary | ICD-10-CM | POA: Diagnosis present

## 2023-02-26 MED ORDER — LETROZOLE 2.5 MG PO TABS: 2.5000 mg | ORAL_TABLET | Freq: Every day | ORAL | 3 refills | Status: DC

## 2023-02-26 NOTE — Assessment & Plan Note (Signed)
03/09/2018: Left lumpectomy: IDC grade 1, 0.9 cm, intermediate grade DCIS, no lymphovascular or perineural invasion, 0/1 lymph node negative,  ER 100%, PR 100%, Ki-67 2%, HER-2 negative IHC 1+,T1bN0 stage Ia Oncotype DX recurrence score 7: 3% RRR at 9 years Adjuvant radiation therapy completed 05/05/2018   Treatment plan: Adjuvant antiestrogen therapy with letrozole 2.5 mg daily starting 05/23/2018   Letrozole toxicities: Denies any hot flashes or myalgias. Duration of antiestrogen therapy was discussed in detail   Breast cancer surveillance:  Mammogram 02/18/2023 benign breast density category C Stereotactic biopsy 02/03/2019: Benign fat necrosis Breast exam: 02/26/2023: Benign    Return to clinic in 1 year for follow-up

## 2023-02-26 NOTE — Progress Notes (Signed)
Patient Care Team: Fleet Contras, MD as PCP - General (Internal Medicine) Ovidio Kin, MD as Consulting Physician (General Surgery) Serena Croissant, MD as Consulting Physician (Hematology and Oncology) Lonie Peak, MD as Attending Physician (Radiation Oncology)  DIAGNOSIS:  Encounter Diagnosis  Name Primary?   Malignant neoplasm of upper-outer quadrant of left breast in female, estrogen receptor positive (HCC) Yes    SUMMARY OF ONCOLOGIC HISTORY: Oncology History  Malignant neoplasm of upper-outer quadrant of left breast in female, estrogen receptor positive (HCC)  02/09/2018 Initial Diagnosis   Screening detected left breast mass UOQ at 12:30 position 9 mm size, axilla negative, ultrasound biopsy revealed grade 2 IDC ER 100%, PR 100%, Ki-67 2%, HER-2 negative IHC 1+, T1b N0 stage Ia AJCC 8   02/18/2018 Genetic Testing   Genetic testing was negative and didn't detect any deleterious mutations.  Variant of Uncertain Significance: MUTYH c.1508G>A (p.Gly503Gl), SDHA c.1919A>G (p.Glu640Gly),SMAD4 c.521C>A (p.Thr174Asn), TERT c.403G>A (p.Gly135Arg). Genes tested include: AIP, ALK, APC, ATM,  AXIN2, BAP1, BARD1, BLM, BMPR1A, BRCA1, BRCA2, BRIP1, CASR, CDC73, CDH1, CDK4, CDKN1B, CDKN1C, CDKN2A (p14ARF), CDKN2A (p16INK4a), CEBPA, CHEK2, CTNNA1, DICER1, DIS3L2, EGFR, EPCAM*, FH , FLCN, GATA2, GPC3, GREM1*, HOXB13, HRAS, KIT, MAX, MEN1, MET, MITF, MLH1, MSH2, MSH3, MSH6, MUTYH, NBN, NF1, NF2, NTHL1, PALB2, PDGFRA, PHOX2B*, PMS2, POLD1, POLE, POT1, PRKAR1A, PTCH1, PTEN, RAD50, RAD51C, RAD51D, RB1, RECQL4, RET, RUNX1, SDHA*, SDHAF2, SDHB, SDHC, SDHD, SMAD4, SMARCA4, SMARCB1,SMARCE1, STK11, SUFU, TERC, TERT, TMEM127,  TP53, TSC1, TSC2, VHL, WRN*, WT1    03/09/2018 Surgery   Left lumpectomy: IDC grade 1, 0.9 cm, intermediate grade DCIS, no lymphovascular or perineural invasion, 0/1 lymph node negative,  ER 100%, PR 100%, Ki-67 2%, HER-2 negative IHC 1+,T1bN0 stage Ia   03/09/2018 Oncotype testing    7/3%   03/17/2018 Cancer Staging   Staging form: Breast, AJCC 8th Edition - Pathologic: Stage IA (pT1b, pN0, cM0, G1, ER+, PR+, HER2-) - Signed by Serena Croissant, MD on 03/17/2018   04/13/2018 - 05/10/2018 Radiation Therapy   Radiation with Basilio Cairo 1. Left Breast / 40.05 Gy in 15 fractions 2. Left Breast Boost / 10 Gy in 5 fractions   05/2018 -  Anti-estrogen oral therapy   Letrozole daily     CHIEF COMPLIANT: Review Mammogram  INTERVAL HISTORY: Kerry Newman is a 72y.o. with above-mentioned history of left breast cancer treated with lumpectomy, radiation, and who is currently on anti-estrogen therapy with letrozole. She presents to the clinic today for a follow-up. Patient reports that her health is good. She denies any pain or discomfort in breast. She does get time for exercise. She says she walks her dog everyday.   ALLERGIES:  is allergic to [place] [brimonidine tartrate-timolol].  MEDICATIONS:  Current Outpatient Medications  Medication Sig Dispense Refill   acetaminophen (TYLENOL) 325 MG tablet Take 650 mg by mouth every 6 (six) hours as needed.     atorvastatin (LIPITOR) 20 MG tablet Take 1 tablet (20 mg total) by mouth daily.     calcium gluconate 500 MG tablet Take 1 tablet by mouth 3 (three) times daily.     calcium-vitamin D 250-100 MG-UNIT tablet Take 1 tablet by mouth 2 (two) times daily.     COVID-19 mRNA Vac-TriS, Pfizer, (PFIZER-BIONT COVID-19 VAC-TRIS) SUSP injection Inject into the muscle. 0.3 mL 0   latanoprost (XALATAN) 0.005 % ophthalmic solution Place 1 drop into both eyes at bedtime. 2.5 mL 12   Multiple Vitamin (MULTIVITAMIN WITH MINERALS) TABS Take 1 tablet by mouth daily.  Multiple Vitamin (MULTIVITAMIN) capsule Take 1 capsule by mouth daily.     prochlorperazine (COMPAZINE) 10 MG tablet Take 1 tablet (10 mg total) by mouth every 6 (six) hours as needed for up to 6 doses for nausea or vomiting. 6 tablet 0   letrozole (FEMARA) 2.5 MG tablet Take 1  tablet (2.5 mg total) by mouth daily. 90 tablet 3   No current facility-administered medications for this visit.    PHYSICAL EXAMINATION: ECOG PERFORMANCE STATUS: 1 - Symptomatic but completely ambulatory  Vitals:   02/26/23 0935  BP: (!) 161/70  Pulse: 71  Resp: 18  Temp: (!) 97.3 F (36.3 C)  SpO2: 100%   Filed Weights   02/26/23 0935  Weight: 148 lb 1.6 oz (67.2 kg)    BREAST: No palpable masses or nodules in either right or left breasts. No palpable axillary supraclavicular or infraclavicular adenopathy no breast tenderness or nipple discharge. (exam performed in the presence of a chaperone)  LABORATORY DATA:  I have reviewed the data as listed    Latest Ref Rng & Units 01/27/2023   12:00 AM 01/18/2020    3:55 PM 02/17/2018    8:36 AM  CMP  Glucose 65 - 99 mg/dL 82  161  096   BUN 7 - 25 mg/dL 14  14  9    Creatinine 0.60 - 1.00 mg/dL 0.45  4.09  8.11   Sodium 135 - 146 mmol/L 143  138  142   Potassium 3.5 - 5.3 mmol/L 3.5  3.7  3.9   Chloride 98 - 110 mmol/L 108  101  105   CO2 20 - 32 mmol/L 24  25  30    Calcium 8.6 - 10.4 mg/dL 8.9  9.0  9.5   Total Protein 6.1 - 8.1 g/dL 7.5  7.7  8.4   Total Bilirubin 0.2 - 1.2 mg/dL 1.1  1.0  0.9   Alkaline Phos 38 - 126 U/L  91  118   AST 10 - 35 U/L 22  25  23    ALT 6 - 29 U/L 16  25  23      Lab Results  Component Value Date   WBC 2.7 (L) 01/27/2023   HGB 12.3 01/27/2023   HCT 37.3 01/27/2023   MCV 95.2 01/27/2023   PLT 183 01/27/2023   NEUTROABS 6.9 01/18/2020    ASSESSMENT & PLAN:  Malignant neoplasm of upper-outer quadrant of left breast in female, estrogen receptor positive (HCC) 03/09/2018: Left lumpectomy: IDC grade 1, 0.9 cm, intermediate grade DCIS, no lymphovascular or perineural invasion, 0/1 lymph node negative,  ER 100%, PR 100%, Ki-67 2%, HER-2 negative IHC 1+,T1bN0 stage Ia Oncotype DX recurrence score 7: 3% RRR at 9 years Adjuvant radiation therapy completed 05/05/2018   Treatment plan: Adjuvant  antiestrogen therapy with letrozole 2.5 mg daily starting 05/23/2018   Letrozole toxicities: Denies any hot flashes or myalgias. Duration of antiestrogen therapy was discussed in detail.  I did remind her that she stay on it for 2 more years.   Breast cancer surveillance:  Mammogram 02/18/2023 benign breast density category C, calcifications around the surgical site 32-month follow-up mammogram recommended. Stereotactic biopsy 02/03/2019: Benign fat necrosis Breast exam: 02/26/2023: Benign Bone density test will be ordered [facility] Return to clinic in 1 year for follow-up    Orders Placed This Encounter  Procedures   DG Bone Density    Standing Status:   Future    Standing Expiration Date:  02/26/2024    Order Specific Question:   Reason for Exam (SYMPTOM  OR DIAGNOSIS REQUIRED)    Answer:   postmenopausal    Order Specific Question:   Preferred imaging location?    Answer:   [facility]   The patient has a good understanding of the overall plan. she agrees with it. she will call with any problems that may develop before the next visit here. Total time spent: 30 mins including face to face time and time spent for planning, charting and co-ordination of care   Tamsen Meek, MD 02/26/23    I Janan Ridge am acting as a Neurosurgeon for [employer]  I have reviewed the above documentation for accuracy and completeness, and I agree with the above.

## 2023-02-27 ENCOUNTER — Other Ambulatory Visit: Payer: Self-pay | Admitting: Adult Health

## 2023-02-27 DIAGNOSIS — R921 Mammographic calcification found on diagnostic imaging of breast: Secondary | ICD-10-CM

## 2023-03-10 ENCOUNTER — Ambulatory Visit (HOSPITAL_BASED_OUTPATIENT_CLINIC_OR_DEPARTMENT_OTHER)
Admission: RE | Admit: 2023-03-10 | Discharge: 2023-03-10 | Disposition: A | Discharge status: Home / Self Care | Payer: No Typology Code available for payment source | Source: Ambulatory Visit | Attending: Hematology and Oncology | Admitting: Hematology and Oncology

## 2023-03-10 DIAGNOSIS — M810 Age-related osteoporosis without current pathological fracture: Secondary | ICD-10-CM | POA: Diagnosis not present

## 2023-03-10 DIAGNOSIS — Z780 Asymptomatic menopausal state: Secondary | ICD-10-CM | POA: Diagnosis not present

## 2023-03-10 DIAGNOSIS — C50412 Malignant neoplasm of upper-outer quadrant of left female breast: Secondary | ICD-10-CM | POA: Insufficient documentation

## 2023-03-10 DIAGNOSIS — Z170 Estrogen receptor positive status [ER+]: Secondary | ICD-10-CM | POA: Insufficient documentation

## 2023-07-06 ENCOUNTER — Encounter: Payer: Self-pay | Admitting: Genetic Counselor

## 2023-07-24 ENCOUNTER — Ambulatory Visit
Admission: EM | Admit: 2023-07-24 | Discharge: 2023-07-24 | Disposition: A | Discharge status: Home / Self Care | Payer: Medicare HMO | Attending: Family Medicine | Admitting: Family Medicine

## 2023-07-24 DIAGNOSIS — R42 Dizziness and giddiness: Secondary | ICD-10-CM

## 2023-07-24 DIAGNOSIS — R519 Headache, unspecified: Secondary | ICD-10-CM

## 2023-07-24 DIAGNOSIS — K5730 Diverticulosis of large intestine without perforation or abscess without bleeding: Secondary | ICD-10-CM | POA: Insufficient documentation

## 2023-07-24 DIAGNOSIS — Z800 Family history of malignant neoplasm of digestive organs: Secondary | ICD-10-CM | POA: Insufficient documentation

## 2023-07-24 DIAGNOSIS — K625 Hemorrhage of anus and rectum: Secondary | ICD-10-CM | POA: Insufficient documentation

## 2023-07-24 DIAGNOSIS — K7689 Other specified diseases of liver: Secondary | ICD-10-CM | POA: Insufficient documentation

## 2023-07-24 DIAGNOSIS — R112 Nausea with vomiting, unspecified: Secondary | ICD-10-CM

## 2023-07-24 DIAGNOSIS — H409 Unspecified glaucoma: Secondary | ICD-10-CM | POA: Insufficient documentation

## 2023-07-24 DIAGNOSIS — H579 Unspecified disorder of eye and adnexa: Secondary | ICD-10-CM

## 2023-07-24 DIAGNOSIS — R55 Syncope and collapse: Secondary | ICD-10-CM | POA: Insufficient documentation

## 2023-07-24 NOTE — ED Provider Notes (Signed)
[facility]    CSN: [id number] Arrival date & time: 07/24/23  1355      History   Chief Complaint Chief Complaint  Patient presents with   Eye Problem    HPI Kerry Newman is a 73 y.o. female.   She has been having issues with her eyes, cataracts and glaucoma.  Recently she has been having intermittent pressure behind her eyes,  which causes nausea and dizziness.  She also notes pressure to the top of her head, like a "bee hive buzzing" to the top of her head.  Taking tylenol for pain, which is helpful.  No pain/pressure today, at this time.  She did have n/v x 1.  This sensation comes/goes.  When this comes on she just has to close her eyes.  At times will last about 30 mins.  She states its so bad she will "hit the floor" and needs to lay down.  This feels different than before with her glaucoma, cataracts.   She does have h/o breast cancer, she follows up with them march 3rd.  She does have a pcp, and has talked about these headaches with him.  She states he just put her on pain pills, which she does not want.  She has dr Karleen Hampshire for an ophthalmologist, but needs to find another.         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   Glaucoma 07/24/2023   Hemorrhage of rectum and anus 07/24/2023   Hepatic cyst 07/24/2023   Syncope and collapse 07/24/2023   Family history of malignant neoplasm of digestive organs 07/24/2023   Chest  wall mass 02/26/2018   Family history of breast cancer    Family history of colon cancer    Family history of prostate cancer    Family history of kidney cancer    Malignant neoplasm of upper-outer quadrant of left breast in female, estrogen receptor positive (HCC) 02/12/2018    Past Surgical History:  Procedure Laterality Date   BREAST EXCISIONAL BIOPSY Left    BREAST LUMPECTOMY Left 02/2018   BREAST LUMPECTOMY WITH RADIOACTIVE SEED AND SENTINEL LYMPH NODE BIOPSY Left 03/09/2018   Procedure: LEFT BREAST LUMPECTOMY WITH RADIOACTIVE SEED AND SENTINEL LYMPH NODE BIOPSY;  Surgeon: Ovidio Kin, MD;  Location: [facility];  Service: General;  Laterality: Left;   CESAREAN SECTION      OB History   No obstetric history on file.      Home Medications    Prior to Admission medications   Medication Sig Start Date End Date Taking? Authorizing Provider  atorvastatin (LIPITOR) 40 MG tablet Take 20 mg by mouth at bedtime. 05/14/23  Yes [provider]  Na Sulfate-K Sulfate-Mg Sulfate concentrate (SUPREP BOWEL PREP KIT) 17.5-3.13-1.6 GM/177ML SOLN See admin instructions. 09/30/21   [provider]  acetaminophen (TYLENOL) 325 MG tablet Take 650 mg by mouth every 6 (six) hours  as needed.    [provider]  atorvastatin (LIPITOR) 20 MG tablet Take 1 tablet (20 mg total) by mouth daily. 02/08/20   Serena Croissant, MD  calcium gluconate 500 MG tablet Take 1 tablet by mouth 3 (three) times daily.    [provider]  calcium-vitamin D 250-100 MG-UNIT tablet Take 1 tablet by mouth 2 (two) times daily. 02/08/20   Serena Croissant, MD  COVID-19 mRNA Vac-TriS, Pfizer, (PFIZER-BIONT COVID-19 VAC-TRIS) SUSP injection Inject into the muscle. 11/21/20   Judyann Munson, MD  latanoprost (XALATAN) 0.005 % ophthalmic solution Place 1 drop into both eyes at bedtime. 02/08/20  Yes Serena Croissant, MD  letrozole [facility]) 2.5 MG tablet Take 1 tablet (2.5 mg total) by mouth  daily. 02/26/23  Yes Serena Croissant, MD  Multiple Vitamin (MULTIVITAMIN WITH MINERALS) TABS Take 1 tablet by mouth daily.    [provider]  Multiple Vitamin (MULTIVITAMIN) capsule Take 1 capsule by mouth daily. 02/08/20   Serena Croissant, MD  prochlorperazine (COMPAZINE) 10 MG tablet Take 1 tablet (10 mg total) by mouth every 6 (six) hours as needed for up to 6 doses for nausea or vomiting. 01/18/20   Evlyn Kanner, MD    Family History Family History  Problem Relation Age of Onset   Breast cancer Sister    Prostate cancer Father    Liver cancer Brother    Kidney cancer Brother    Stomach cancer Sister     Social History Social History   Tobacco Use   Smoking status: Never    Passive exposure: Never   Smokeless tobacco: Never  Vaping Use   Vaping status: Never Used  Substance Use Topics   Alcohol use: No   Drug use: No     Allergies   Combigan [brimonidine tartrate-timolol]   Review of Systems Review of Systems  Constitutional: Negative.   HENT: Negative.    Eyes:  Positive for pain.  Respiratory: Negative.    Cardiovascular: Negative.   Gastrointestinal: Negative.   Genitourinary: Negative.   Musculoskeletal: Negative.   Neurological:  Positive for dizziness and headaches.  Psychiatric/Behavioral: Negative.       Physical Exam Triage Vital Signs ED Triage Vitals  Encounter Vitals Group     BP 07/24/23 1407 (!) 142/74     Systolic BP Percentile --      Diastolic BP Percentile --      Pulse Rate 07/24/23 1407 74     Resp 07/24/23 1407 18     Temp 07/24/23 1407 97.9 F (36.6 C)     Temp Source 07/24/23 1407 Oral     SpO2 07/24/23 1407 98 %     Weight 07/24/23 1405 148 lb 2.4 oz (67.2 kg)     Height 07/24/23 1405 5\' 9"  (1.753 m)     Head Circumference --      Peak Flow --      Pain Score 07/24/23 1405 0     Pain Loc --      Pain Education --      Exclude from Growth Chart --    No data found.  Updated Vital Signs BP (!) 142/74 (BP  Location: Right Arm) Comment: "it is usaully a little high when i come to these places, you can take it again later if you need too".  Pulse 74   Temp 97.9 F (36.6 C) (Oral)   Resp 18   Ht 5\' 9"  (1.753 m)   Wt 67.2 kg   SpO2 98%  BMI 21.88 kg/m   Visual Acuity Right Eye Distance:   Left Eye Distance:   Bilateral Distance:    Right Eye Near:   Left Eye Near:    Bilateral Near:     Physical Exam Constitutional:      General: She is not in acute distress.    Appearance: Normal appearance. She is normal weight. She is not ill-appearing or toxic-appearing.  HENT:     Head: Normocephalic and atraumatic.     Nose: Nose normal.  Eyes:     General: Lids are normal.     Extraocular Movements: Extraocular movements intact.     Pupils: Pupils are equal, round, and reactive to light.  Cardiovascular:     Rate and Rhythm: Normal rate and regular rhythm.  Pulmonary:     Effort: Pulmonary effort is normal.     Breath sounds: Normal breath sounds.  Musculoskeletal:     Cervical back: Normal range of motion and neck supple. No tenderness.  Skin:    General: Skin is warm.  Neurological:     General: No focal deficit present.     Mental Status: She is alert.  Psychiatric:        Mood and Affect: Mood normal.      UC Treatments / Results  Labs (all labs ordered are listed, but only abnormal results are displayed) Labs Reviewed - No data to display  EKG   Radiology No results found.  Procedures Procedures (including critical care time)  Medications Ordered in UC Medications - No data to display  Initial Impression / Assessment and Plan / UC Course  I have reviewed the triage vital signs and the nursing notes.  Pertinent labs & imaging results that were available during my care of the patient were reviewed by me and considered in my medical decision making (see chart for details).   Final Clinical Impressions(s) / UC Diagnoses   Final diagnoses:  Nonintractable  headache, unspecified chronicity pattern, unspecified headache type  Eye pressure  Nausea and vomiting, unspecified vomiting type  Dizziness     Discharge Instructions      You were seen today for intermittent headache, eye pressure, dizziness, vomiting.  Since you are without pain today I do not feel that you need to go to the ER at this time.   I have make a referral to [facility].  Please call [phone] to make an appointment.  Please discuss your symptoms further with your primary care provider for further work up as well.  If you develop pain with nausea, vomiting, dizziness then please go to the ER for further evaluation.     ED Prescriptions   None    PDMP not reviewed this encounter.   Jannifer Franklin, MD 07/24/23 [phone]

## 2023-07-24 NOTE — Discharge Instructions (Signed)
You were seen today for intermittent headache, eye pressure, dizziness, vomiting.  Since you are without pain today I do not feel that you need to go to the ER at this time.   I have make a referral to [facility].  Please call [phone] to make an appointment.  Please discuss your symptoms further with your primary care provider for further work up as well.  If you develop pain with nausea, vomiting, dizziness then please go to the ER for further evaluation.

## 2023-07-24 NOTE — ED Triage Notes (Signed)
"  It has been about 2 wks, I have Cataracts and Glaucoma (previous surgery), here lately the pressure in the back of my eyes is building up this pressure is causing nausea and buzzing in my head". No chest pain. No sob. No cardiac concerns. No other acute symptoms or concerns.

## 2023-07-25 ENCOUNTER — Other Ambulatory Visit: Payer: Self-pay

## 2023-07-25 ENCOUNTER — Emergency Department (HOSPITAL_COMMUNITY): Payer: Medicare HMO

## 2023-07-25 ENCOUNTER — Encounter (HOSPITAL_COMMUNITY): Payer: Self-pay | Admitting: Emergency Medicine

## 2023-07-25 ENCOUNTER — Emergency Department (HOSPITAL_COMMUNITY)
Admission: EM | Admit: 2023-07-25 | Discharge: 2023-07-25 | Disposition: A | Discharge status: Home / Self Care | Payer: Medicare HMO | Attending: Student | Admitting: Student

## 2023-07-25 DIAGNOSIS — H5789 Other specified disorders of eye and adnexa: Secondary | ICD-10-CM | POA: Insufficient documentation

## 2023-07-25 DIAGNOSIS — R519 Headache, unspecified: Secondary | ICD-10-CM | POA: Diagnosis present

## 2023-07-25 DIAGNOSIS — R55 Syncope and collapse: Secondary | ICD-10-CM | POA: Diagnosis present

## 2023-07-25 DIAGNOSIS — H5713 Ocular pain, bilateral: Secondary | ICD-10-CM | POA: Diagnosis present

## 2023-07-25 LAB — COMPREHENSIVE METABOLIC PANEL
ALT: 19 U/L (ref 0–44)
AST: 20 U/L (ref 15–41)
Albumin: 3.8 g/dL (ref 3.5–5.0)
Alkaline Phosphatase: 102 U/L (ref 38–126)
Anion gap: 6 (ref 5–15)
BUN: 13 mg/dL (ref 8–23)
CO2: 25 mmol/L (ref 22–32)
Calcium: 9 mg/dL (ref 8.9–10.3)
Chloride: 107 mmol/L (ref 98–111)
Creatinine, Ser: 0.89 mg/dL (ref 0.44–1.00)
GFR, Estimated: >60 mL/min (ref >60)
Glucose, Bld: 89 mg/dL (ref 70–99)
Potassium: 4 mmol/L (ref 3.5–5.1)
Sodium: 138 mmol/L (ref 135–145)
Total Bilirubin: 1.6 mg/dL — ABNORMAL HIGH (ref 0.0–1.2)
Total Protein: 7.6 g/dL (ref 6.5–8.1)

## 2023-07-25 LAB — CBC WITH DIFFERENTIAL/PLATELET
Abs Immature Granulocytes: 0.01 10*3/uL (ref 0.00–0.07)
Basophils Absolute: 0 10*3/uL (ref 0.0–0.1)
Basophils Relative: 1 %
Eosinophils Absolute: 0.1 10*3/uL (ref 0.0–0.5)
Eosinophils Relative: 2 %
HCT: 37.2 % (ref 36.0–46.0)
Hemoglobin: 12.1 g/dL (ref 12.0–15.0)
Immature Granulocytes: 0 %
Lymphocytes Relative: 24 %
Lymphs Abs: 1 10*3/uL (ref 0.7–4.0)
MCH: 31.4 pg (ref 26.0–34.0)
MCHC: 32.5 g/dL (ref 30.0–36.0)
MCV: 96.6 fL (ref 80.0–100.0)
Monocytes Absolute: 0.6 10*3/uL (ref 0.1–1.0)
Monocytes Relative: 14 %
Neutro Abs: 2.4 10*3/uL (ref 1.7–7.7)
Neutrophils Relative %: 59 %
Platelets: 203 10*3/uL (ref 150–400)
RBC: 3.85 MIL/uL — ABNORMAL LOW (ref 3.87–5.11)
RDW: 13.1 % (ref 11.5–15.5)
WBC: 4 10*3/uL (ref 4.0–10.5)
nRBC: 0 % (ref 0.0–0.2)

## 2023-07-25 MED ORDER — GADOBUTROL 1 MMOL/ML IV SOLN
7.0000 mL | Freq: Once | INTRAVENOUS | Status: AC | PRN
  Administered 2023-07-25: 7 mL via INTRAVENOUS

## 2023-07-25 MED ORDER — TETRACAINE HCL 0.5 % OP SOLN
2.0000 [drp] | Freq: Once | OPHTHALMIC | Status: AC
  Administered 2023-07-25: 2 [drp] via OPHTHALMIC
  Filled 2023-07-25: qty 4

## 2023-07-25 MED ORDER — FLUORESCEIN SODIUM 1 MG OP STRP
1.0000 | ORAL_STRIP | Freq: Once | OPHTHALMIC | Status: AC
  Administered 2023-07-25: 1 via OPHTHALMIC
  Filled 2023-07-25: qty 1

## 2023-07-25 NOTE — ED Provider Notes (Signed)
EMERGENCY DEPARTMENT AT [facility] Provider Note   CSN: [id number] Arrival date & time: 07/25/23  1313    History  Chief Complaint  Patient presents with   Eye Pain    Kerry Newman is a 73 y.o. female here for evaluation of HA and eye pressure. States intermittent x 3-4 weeks.  States she will have a headache which feels like "bees in my head."  Then she becomes nauseated, has pressure behind her bilateral eyes and gets a room spinning sensation.  Typically symptoms last about 30 minutes to 1 hour and self resolved. No sudden onset thunderclap HA. She states she was seen initially by her ophthalmologist, Dr. Karleen Hampshire however she states he told her she only needed glasses. She is unsure is she had pressures measured to there eyes. She does have extensive family history of MS. She denies any diplopia.  She currently has no symptoms.  No numbness, weakness.  No facial droop, difficulty with word finding.  No flashers or floaters in her eyes.  Seen by urgent care yesterday. No syncope event but dizziness episodes make her feel like she may pass out. No CP, SOB, back pain, abd pain during episode. No palpitations. Takes tylenol which helps with symptoms. Has been seen by PCP for these apparently and he prescribed her "some pain meds" however she does not like to take these.  HPI     Home Medications Prior to Admission medications   Medication Sig Start Date End Date Taking? Authorizing Provider  acetaminophen (TYLENOL) 325 MG tablet Take 650 mg by mouth every 6 (six) hours as needed.    [provider]  atorvastatin (LIPITOR) 20 MG tablet Take 1 tablet (20 mg total) by mouth daily. 02/08/20   Serena Croissant, MD  atorvastatin (LIPITOR) 40 MG tablet Take 20 mg by mouth at bedtime. 05/14/23   [provider]  calcium gluconate 500 MG tablet Take 1 tablet by mouth 3 (three) times daily.    [provider]  calcium-vitamin D 250-100 MG-UNIT tablet  Take 1 tablet by mouth 2 (two) times daily. 02/08/20   Serena Croissant, MD  COVID-19 mRNA Vac-TriS, Pfizer, (PFIZER-BIONT COVID-19 VAC-TRIS) SUSP injection Inject into the muscle. 11/21/20   Judyann Munson, MD  latanoprost (XALATAN) 0.005 % ophthalmic solution Place 1 drop into both eyes at bedtime. 02/08/20   Serena Croissant, MD  letrozole [facility]) 2.5 MG tablet Take 1 tablet (2.5 mg total) by mouth daily. 02/26/23   Serena Croissant, MD  Multiple Vitamin (MULTIVITAMIN WITH MINERALS) TABS Take 1 tablet by mouth daily.    [provider]  Multiple Vitamin (MULTIVITAMIN) capsule Take 1 capsule by mouth daily. 02/08/20   Serena Croissant, MD  Na Sulfate-K Sulfate-Mg Sulfate concentrate (SUPREP BOWEL PREP KIT) 17.5-3.13-1.6 GM/177ML SOLN See admin instructions. 09/30/21   [provider]  prochlorperazine (COMPAZINE) 10 MG tablet Take 1 tablet (10 mg total) by mouth every 6 (six) hours as needed for up to 6 doses for nausea or vomiting. 01/18/20   Evlyn Kanner, MD      Allergies    Combigan [brimonidine tartrate-timolol]    Review of Systems   Review of Systems  Constitutional: Negative.   HENT: Negative.    Eyes:  Positive for visual disturbance (pressure behind eyes BIL).  Respiratory: Negative.    Cardiovascular: Negative.   Gastrointestinal: Negative.   Genitourinary: Negative.   Musculoskeletal: Negative.   Skin: Negative.   Neurological:  Positive for headaches. Negative for dizziness,  tremors, seizures, facial asymmetry, speech difficulty, weakness, light-headedness and numbness. Syncope: near syncope. All other systems reviewed and are negative.   Physical Exam Updated Vital Signs BP (!) 153/78 (BP Location: Right Arm)   Pulse 72   Temp 98.1 F (36.7 C) (Oral)   Resp 17   Ht 5\' 9"  (1.753 m)   Wt 67.2 kg   SpO2 100%   BMI 21.88 kg/m  Physical Exam Vitals and nursing note reviewed.  Constitutional:      General: She is not in acute distress.    Appearance: She is  well-developed. She is not ill-appearing, toxic-appearing or diaphoretic.  HENT:     Head: Normocephalic and atraumatic.     Nose: Nose normal.  Eyes:     General: Vision grossly intact. No allergic shiner, visual field deficit or scleral icterus.       Right eye: No foreign body, discharge or hordeolum.        Left eye: No foreign body, discharge or hordeolum.     Intraocular pressure: Right eye pressure is 14 mmHg. Left eye pressure is 20 mmHg.     Extraocular Movements: Extraocular movements intact.     Conjunctiva/sclera: Conjunctivae normal.     Pupils: Pupils are equal, round, and reactive to light.     Visual Fields: Right eye visual fields normal and left eye visual fields normal.     Comments: Bedside US neg for retinal detachment, hemorrhage IOP 14,17,16 (right) 19,17,20 (left) PERLA, EOM intact without pain No periorbital erythema  Neck:     Trachea: Trachea and phonation normal.     Comments: Full ROM Cardiovascular:     Rate and Rhythm: Normal rate.     Pulses: Normal pulses.          Radial pulses are 2+ on the right side and 2+ on the left side.       Dorsalis pedis pulses are 2+ on the right side and 2+ on the left side.     Heart sounds: Normal heart sounds.  Pulmonary:     Effort: Pulmonary effort is normal. No respiratory distress.     Breath sounds: Normal breath sounds and air entry.     Comments: Clear BIL, speaks in full sentences without difficulty Abdominal:     General: Bowel sounds are normal. There is no distension.     Palpations: Abdomen is soft.     Tenderness: There is no abdominal tenderness.     Comments: nontender  Musculoskeletal:        General: No swelling. Normal range of motion.     Cervical back: Full passive range of motion without pain and normal range of motion.     Right lower leg: No edema.     Left lower leg: No edema.     Comments: No bony tenderness, full rom, compartments soft  Skin:    General: Skin is warm and dry.      Comments: No rashes or lesions on exposed skin  Neurological:     General: No focal deficit present.     Mental Status: She is alert and oriented to person, place, and time.     Cranial Nerves: Cranial nerves 2-12 are intact. No cranial nerve deficit.     Sensory: Sensation is intact. No sensory deficit.     Motor: Motor function is intact. No weakness.     Coordination: Coordination is intact. Coordination normal.     Gait: Gait is intact. Gait normal.  Psychiatric:        Mood and Affect: Mood normal.    ED Results / Procedures / Treatments   Labs (all labs ordered are listed, but only abnormal results are displayed) Labs Reviewed  CBC WITH DIFFERENTIAL/PLATELET - Abnormal; Notable for the following components:      Result Value   RBC 3.85 (*)    All other components within normal limits  COMPREHENSIVE METABOLIC PANEL - Abnormal; Notable for the following components:   Total Bilirubin 1.6 (*)    All other components within normal limits    EKG None  Radiology MR Brain W and Wo Contrast Result Date: 07/25/2023 CLINICAL DATA:  Demyelinating disease; Optic neuritis suspected EXAM: MRI HEAD WITHOUT AND WITH CONTRAST MRI ORBITS WITHOUT AND WITH CONTRAST MRI CERVICAL SPINE WITHOUT AND WITH CONTRAST TECHNIQUE: Multiplanar, multiecho pulse sequences of the brain and surrounding structures were obtained without and with intravenous contrast. Multiplanar, multiecho pulse sequences of the orbits and surrounding structures were obtained including fat saturation techniques, before and after intravenous contrast administration. Multiplanar, multiecho pulse sequences of the brain and surrounding structures, and cervical spine, to include the craniocervical junction and cervicothoracic junction, were obtained without and with intravenous contrast. CONTRAST:  7mL GADAVIST GADOBUTROL 1 MMOL/ML IV SOLN COMPARISON:  None Available. FINDINGS: MRI HEAD FINDINGS Brain: Mild for age scattered T2/FLAIR  hyperintense in the white matter. No pathologic enhancement. No evidence of acute infarct, acute hemorrhage, mass lesion or midline shift. Vascular: Major arterial flow voids are maintained at the skull base. Skull and upper cervical spine: Normal marrow signal. Other: No mastoid effusions. MRI ORBITS FINDINGS Orbits: No traumatic or inflammatory finding. Globes, optic nerves, orbital fat, extraocular muscles, vascular structures, and lacrimal glands are normal. Visualized sinuses: Clear. Soft tissues: Negative. MRI CERVICAL SPINE FINDINGS Alignment: No substantial sagittal subluxation. Vertebral bodies: Vertebral body heights are maintained. No evidence of acute fracture or discitis/osteomyelitis. No suspicious bone lesion. Cord: No convincing cord signal abnormality. No abnormal enhancement. Disc levels: Mild for age facet and uncovertebral hypertrophy at multiple levels without significant canal or foraminal stenosis. IMPRESSION: MRI head: Mild for age scattered T2/FLAIR hyperintense in the white matter, which are nonspecific but could relate to chronic microvascular ischemic disease or demyelination. No enhancement to suggest active demyelination. MRI cervical spine: 1. No convincing cord signal abnormality.  No cord enhancement. 2. Mild for age degenerative change without significant stenosis. Electronically Signed   By: Feliberto Harts M.D.   On: 07/25/2023 22:18   MR ORBITS W WO CONTRAST Result Date: 07/25/2023 CLINICAL DATA:  Demyelinating disease; Optic neuritis suspected EXAM: MRI HEAD WITHOUT AND WITH CONTRAST MRI ORBITS WITHOUT AND WITH CONTRAST MRI CERVICAL SPINE WITHOUT AND WITH CONTRAST TECHNIQUE: Multiplanar, multiecho pulse sequences of the brain and surrounding structures were obtained without and with intravenous contrast. Multiplanar, multiecho pulse sequences of the orbits and surrounding structures were obtained including fat saturation techniques, before and after intravenous contrast  administration. Multiplanar, multiecho pulse sequences of the brain and surrounding structures, and cervical spine, to include the craniocervical junction and cervicothoracic junction, were obtained without and with intravenous contrast. CONTRAST:  7mL GADAVIST GADOBUTROL 1 MMOL/ML IV SOLN COMPARISON:  None Available. FINDINGS: MRI HEAD FINDINGS Brain: Mild for age scattered T2/FLAIR hyperintense in the white matter. No pathologic enhancement. No evidence of acute infarct, acute hemorrhage, mass lesion or midline shift. Vascular: Major arterial flow voids are maintained at the skull base. Skull and upper cervical spine: Normal marrow signal. Other: No mastoid effusions. MRI  ORBITS FINDINGS Orbits: No traumatic or inflammatory finding. Globes, optic nerves, orbital fat, extraocular muscles, vascular structures, and lacrimal glands are normal. Visualized sinuses: Clear. Soft tissues: Negative. MRI CERVICAL SPINE FINDINGS Alignment: No substantial sagittal subluxation. Vertebral bodies: Vertebral body heights are maintained. No evidence of acute fracture or discitis/osteomyelitis. No suspicious bone lesion. Cord: No convincing cord signal abnormality. No abnormal enhancement. Disc levels: Mild for age facet and uncovertebral hypertrophy at multiple levels without significant canal or foraminal stenosis. IMPRESSION: MRI head: Mild for age scattered T2/FLAIR hyperintense in the white matter, which are nonspecific but could relate to chronic microvascular ischemic disease or demyelination. No enhancement to suggest active demyelination. MRI cervical spine: 1. No convincing cord signal abnormality.  No cord enhancement. 2. Mild for age degenerative change without significant stenosis. Electronically Signed   By: Feliberto Harts M.D.   On: 07/25/2023 22:18   MR Cervical Spine W and Wo Contrast Result Date: 07/25/2023 CLINICAL DATA:  Demyelinating disease; Optic neuritis suspected EXAM: MRI HEAD WITHOUT AND WITH CONTRAST  MRI ORBITS WITHOUT AND WITH CONTRAST MRI CERVICAL SPINE WITHOUT AND WITH CONTRAST TECHNIQUE: Multiplanar, multiecho pulse sequences of the brain and surrounding structures were obtained without and with intravenous contrast. Multiplanar, multiecho pulse sequences of the orbits and surrounding structures were obtained including fat saturation techniques, before and after intravenous contrast administration. Multiplanar, multiecho pulse sequences of the brain and surrounding structures, and cervical spine, to include the craniocervical junction and cervicothoracic junction, were obtained without and with intravenous contrast. CONTRAST:  7mL GADAVIST GADOBUTROL 1 MMOL/ML IV SOLN COMPARISON:  None Available. FINDINGS: MRI HEAD FINDINGS Brain: Mild for age scattered T2/FLAIR hyperintense in the white matter. No pathologic enhancement. No evidence of acute infarct, acute hemorrhage, mass lesion or midline shift. Vascular: Major arterial flow voids are maintained at the skull base. Skull and upper cervical spine: Normal marrow signal. Other: No mastoid effusions. MRI ORBITS FINDINGS Orbits: No traumatic or inflammatory finding. Globes, optic nerves, orbital fat, extraocular muscles, vascular structures, and lacrimal glands are normal. Visualized sinuses: Clear. Soft tissues: Negative. MRI CERVICAL SPINE FINDINGS Alignment: No substantial sagittal subluxation. Vertebral bodies: Vertebral body heights are maintained. No evidence of acute fracture or discitis/osteomyelitis. No suspicious bone lesion. Cord: No convincing cord signal abnormality. No abnormal enhancement. Disc levels: Mild for age facet and uncovertebral hypertrophy at multiple levels without significant canal or foraminal stenosis. IMPRESSION: MRI head: Mild for age scattered T2/FLAIR hyperintense in the white matter, which are nonspecific but could relate to chronic microvascular ischemic disease or demyelination. No enhancement to suggest active  demyelination. MRI cervical spine: 1. No convincing cord signal abnormality.  No cord enhancement. 2. Mild for age degenerative change without significant stenosis. Electronically Signed   By: Feliberto Harts M.D.   On: 07/25/2023 22:18   CT Head Wo Contrast Result Date: 07/25/2023 CLINICAL DATA:  Headache pain and pressure behind the eye EXAM: CT HEAD WITHOUT CONTRAST TECHNIQUE: Contiguous axial images were obtained from the base of the skull through the vertex without intravenous contrast. RADIATION DOSE REDUCTION: This exam was performed according to the departmental dose-optimization program which includes automated exposure control, adjustment of the mA and/or kV according to patient size and/or use of iterative reconstruction technique. COMPARISON:  CT brain 09/10/2016 FINDINGS: Brain: No acute territorial infarction, hemorrhage or intracranial mass. Mild white matter hypodensity suggesting chronic small vessel ischemic change. Nonenlarged ventricles. Possible small focus of cortical/subcortical encephalomalacia involving the right frontal lobe, series 3, image 10, sagittal series 6, image 17  as may be seen with small chronic infarct. Vascular: No hyperdense vessels.  No unexpected calcification Skull: No fracture or focal bone lesion Sinuses/Orbits: Possible differential density at the posterior right greater than left globe, axial series 3, image 25 and sagittal series 6, image 17 and 38. Other: None IMPRESSION: 1. No CT evidence for acute intracranial abnormality. 2. Mild chronic small vessel ischemic changes of the white matter. Possible small chronic infarct involving the right frontal lobe. 3. Possible differential density at the posterior right greater than left globes, recommend correlation with ophthalmologic exam. Electronically Signed   By: Jasmine Pang M.D.   On: 07/25/2023 15:46    Procedures Ultrasound ED Ocular  Date/Time: 07/25/2023 7:41 PM  Performed by: Ralph Leyden A,  PA-C Authorized by: Ralph Leyden A, PA-C   PROCEDURE DETAILS:    Indications: visual change     Assessed:  Left eye and right eye   Left eye axial view: obtained     Left eye saggital view: obtained     Right eye axial view: obtained     Right eye sagittal view: obtained     Images: archived     Limitations:  None RIGHT EYE FINDINGS:     no foreign body noted in right eye    right eye lens not dislodged    no retrobulbar hematoma in right eye    no evidence of retinal detachment of the right eye    no ruptured globe in right eye    no vitreous hemorrhage in right eye LEFT EYE FINDINGS:     no foreign body noted in left eye    left eye lens not dislodged    no retrobulbar hematoma in left eye    no evidence of retinal detachment of the left eye    no ruptured globe in left eye    no vitreous hemorrhage in left eye     Medications Ordered in ED Medications  tetracaine (PONTOCAINE) 0.5 % ophthalmic solution 2 drop (2 drops Both Eyes Given 07/25/23 2315)  fluorescein ophthalmic strip 1 strip (1 strip Both Eyes Given 07/25/23 2315)  gadobutrol (GADAVIST) 1 MMOL/ML injection 7 mL (7 mLs Intravenous Contrast Given 07/25/23 2159)  gadobutrol (GADAVIST) 1 MMOL/ML injection 7 mL (7 mLs Intravenous Contrast Given 07/25/23 2203)  gadobutrol (GADAVIST) 1 MMOL/ML injection 7 mL (7 mLs Intravenous Contrast Given 07/25/23 2204)   ED Course/ Medical Decision Making/ A&P Clinical Course as of 07/25/23 2327  Sat Jul 25, 2023  1841 Attending spoke with Dr. Jenene Slicker with Optho, states findings on CT around orbits are consistent with hx of glaucoma surgery [BH]    Clinical Course User Index [BH] Oris Staffieri A, PA-C   73 year old here for intermittent HA over the last month. Get pressure behind BIL eyes and dizziness. Sx last for about 30 min to 1 hour and self resolve. No thunderclap headache.  She is currently asymptomatic.  She denies any actual syncopal events.  No pain to the actual eye itself.   No vision changes. No numbness or weakness. She does have famliy hx of MS. She has a nonfocal neuroexam without deficits.   Bedside ultrasound, no detachment, hemorrhage bilateral eyes.  Given history of MS will obtain MRI.  IOP WNL bilaterally.  Low suspicion for acute angle glaucoma.  No sudden onset of headache to suggest bleed, SAH.  No fever, neck stiffness or neck rigidity to suggest meningitis.  Labs and imaging personally viewed and interpreted:  CBC  without leukocytosis CMP without significant abnormality CT head no acute intracranial abnormality, possible differential density at the posterior right greater than left globe. MRI nonspecific changes, no mass, bleed, active demyelination, CVA, cord compression, infectious process  Discussed results with patient.  Will have her follow up outpatient. Return for new or worsening symptoms  The patient has been appropriately medically screened and/or stabilized in the ED. I have low suspicion for any other emergent medical condition which would require further screening, evaluation or treatment in the ED or require inpatient management.  Patient is hemodynamically stable and in no acute distress.  Patient able to ambulate in department prior to ED.  Evaluation does not show acute pathology that would require ongoing or additional emergent interventions while in the emergency department or further inpatient treatment.  I have discussed the diagnosis with the patient and answered all questions.  Pain is been managed while in the emergency department and patient has no further complaints prior to discharge.  Patient is comfortable with plan discussed in room and is stable for discharge at this time.  I have discussed strict return precautions for returning to the emergency department.  Patient was encouraged to follow-up with PCP/specialist refer to at discharge.                                  Medical Decision Making Amount and/or Complexity of  Data Reviewed Independent Historian:     Details: Family in room External Data Reviewed: labs, radiology and notes. Labs: ordered. Decision-making details documented in ED Course. Radiology: ordered and independent interpretation performed. Decision-making details documented in ED Course.  Risk OTC drugs. Prescription drug management. Decision regarding hospitalization. Diagnosis or treatment significantly limited by social determinants of health.          Final Clinical Impression(s) / ED Diagnoses Final diagnoses:  Acute nonintractable headache, unspecified headache type    Rx / DC Orders ED Discharge Orders          Ordered    Ambulatory referral to Neurology       Comments: An appointment is requested in approximately: 2 weeks   07/25/23 2313              Elnore Cosens A, PA-C 07/25/23 2328    Kommor, Wyn Forster, MD 07/26/23 [phone]

## 2023-07-25 NOTE — ED Triage Notes (Signed)
Pt complains pain and pressure behind both eyes x 2 weeks. Worse on left. Went to UC last night and was advised to come to ER if worsen. Pt states pressure gets so bad causes nausea and vomiting.

## 2023-07-25 NOTE — ED Provider Triage Note (Addendum)
Emergency Medicine Provider Triage Evaluation Note  Kerry Newman , a 73 y.o. female  was evaluated in triage.  Pt complains of headache. States that same has been ongoing intermittently for the last 2 weeks without a discernible trigger.  States that when the first episode occurred she had nausea vomiting and then had a syncopal episode.  During the second episode she felt like she was going to syncopized but was able to lay down before she passed out.  She states that when her symptoms occur, she begins to feel pressure behind both of her eyes as well as nausea.  States if she lays down and closes her eyes usually her symptoms resolve in about 15 to 30 minutes.  She does note that she feels a room spinning sensation when these intervals occur.  When she is not having these episodes she feels normal.  Denies any specific vision changes and has normal vision otherwise.  Does endorse light sensitivity, however states this is chronic since she had glaucoma.  This does not feel like when she has had glaucoma previously. Last episode she had was at noon today and lasted about 30 minutes  Review of Systems  Positive:  Negative:   Physical Exam  BP (!) 170/69   Pulse 78   Temp 98.7 F (37.1 C)   Resp 16   Ht 5\' 9"  (1.753 m)   Wt 67.2 kg   SpO2 98%   BMI 21.88 kg/m  Gen:   Awake, no distress   Resp:  Normal effort  MSK:   Moves extremities without difficulty  Other:  PERRLA, EOMs intact without pain. No focal neurologic deficits  Medical Decision Making  Medically screening exam initiated at 2:32 PM.  Appropriate orders placed.  Paolina Karwowski was informed that the remainder of the evaluation will be completed by another provider, this initial triage assessment does not replace that evaluation, and the importance of remaining in the ED until their evaluation is complete.     Silva Bandy, PA-C 07/25/23 1436    Silva Bandy, PA-C 07/25/23 1437

## 2023-07-25 NOTE — Discharge Instructions (Addendum)
Make sure to follow up with the eye doctor and the Neurologist. I have placed their numbers in your discharge paperwork.  Call to schedule an appointment.

## 2023-07-25 NOTE — ED Notes (Signed)
 Patient transported to MRI

## 2023-07-30 ENCOUNTER — Ambulatory Visit: Payer: Medicare HMO | Admitting: Neurology

## 2023-07-30 ENCOUNTER — Encounter: Payer: Self-pay | Admitting: Neurology

## 2023-07-30 VITALS — BP 152/84 | HR 79 | Ht 69.0 in | Wt 150.4 lb

## 2023-07-30 DIAGNOSIS — R519 Headache, unspecified: Secondary | ICD-10-CM

## 2023-07-30 DIAGNOSIS — H5713 Ocular pain, bilateral: Secondary | ICD-10-CM

## 2023-07-30 MED ORDER — PREDNISONE 20 MG PO TABS
60.0000 mg | ORAL_TABLET | Freq: Every day | ORAL | 0 refills | Status: DC
Start: 2023-07-30 — End: 2024-02-29

## 2023-07-30 NOTE — Progress Notes (Signed)
 [facility]    Provider:  Dr Ines Requesting Provider: Edie Newman LABOR, Newman-C Primary Care Provider:  Shelda Atlas, MD  CC:  headaches  HPI:  Kerry Newman is Newman 73 y.o. female here as requested by Kerry, Kerry Newman, Newman-C for headaches. has Malignant neoplasm of upper-outer quadrant of left breast in female, estrogen receptor positive (HCC); Family history of breast cancer; Family history of colon cancer; Family history of prostate cancer; Family history of kidney cancer; Chest wall mass; Diverticular disease of colon; Glaucoma; Hemorrhage of rectum and anus; Hepatic cyst; Syncope and collapse; and Family history of malignant neoplasm of digestive organs on their problem list. No history of migraines. Started Newman few weeks ago, pressure behind the eyes causing dizziness, she had seen her eye doctor and said her vision was 20/20. She was restarted letrozole  in September. [facility] having pressure behind the eyes. Also nausea and dizziness and buzzing on the head.  And nausea/vomiting. The sensations would come and go, more dizziness the dizziness started it and head was spinning and head was hurting and pressure in the eyes. Happened when she was watching TV. Comes and goes for about 30 minutes and vision pain, no throbbing in the temples but behind the eyes and throbbing, light and sound sensitivity. Blood pressure seems to be elevated howevere is it pain causing the elevated blood pressure or is it the blood pressure causing the headache. Today she has Newman slight headache at the temples. No stiff neck, no fevers. The buzzing is coming from the back of the neck ([ponts to the occipital nerve emergence) no neck pain, no pain on chewing, no pain in the TMJs. She has bene referred to an ophthalmologist, no vision loss, no changes in colors. Daughter here and also provides information. No other reported focal neurologic deficits, associated symptoms,  inciting events or modifiable factors.    Reviewed notes, labs and imaging from outside physicians, which showed:   Reviewed notes from ED Kerry Kerry Newman: Kerry Newman is Newman 73 y.o. female here for evaluation of HA and eye pressure. States intermittent x 3-4 weeks.  States she will have Newman headache which feels like bees in my head.  Then she becomes nauseated, has pressure behind her bilateral eyes and gets Newman room spinning sensation.  Typically symptoms last about 30 minutes to 1 hour and self resolved. No sudden onset thunderclap HA. She states she was seen initially by her ophthalmologist, Kerry Newman however she states he told her she only needed glasses. She is unsure is she had pressures measured to there eyes. She does have extensive family history of MS. She denies any diplopia.  She currently has no symptoms.  No numbness, weakness.  No facial droop, difficulty with word finding.  No flashers or floaters in her eyes.  Seen by urgent care yesterday. No syncope event but dizziness episodes make her feel like she may pass out. No CP, SOB, back pain, abd pain during episode. No palpitations. Takes tylenol  which helps with symptoms. Has been seen by PCP for these apparently and he prescribed her some pain meds however she does not like to take these.   Workup included bedside ultrasound negative for retinal detachment or hemorrhage, IOP was 14 on the right and 19 in the left, pupils equally round and reactive to light, EOMI intact without pain, no periorbital erythema, further eye findings unremarkable/normal, she was currently asymptomatic in the emergency room, no vision  changes, no numbness or weakness.  Nonfocal neuroexam without deficits.  Bedside ultrasound no detachment hemorrhage or any concerning findings.     Latest Ref Rng & Units 07/25/2023    2:36 PM 01/27/2023   12:00 AM 01/18/2020    3:55 PM  CBC  WBC 4.0 - 10.5 K/uL 4.0  2.7  9.2   Hemoglobin 12.0 - 15.0 g/dL 87.8  87.6   87.9   Hematocrit 36.0 - 46.0 % 37.2  37.3  37.0   Platelets 150 - 400 K/uL 203  183  198       Latest Ref Rng & Units 07/25/2023    2:36 PM 01/27/2023   12:00 AM 01/18/2020    3:55 PM  CMP  Glucose 70 - 99 mg/dL 89  82  837   BUN 8 - 23 mg/dL 13  14  14    Creatinine 0.44 - 1.00 mg/dL 9.10  9.25  9.14   Sodium 135 - 145 mmol/L 138  143  138   Potassium 3.5 - 5.1 mmol/L 4.0  3.5  3.7   Chloride 98 - 111 mmol/L 107  108  101   CO2 22 - 32 mmol/L 25  24  25    Calcium  8.9 - 10.3 mg/dL 9.0  8.9  9.0   Total Protein 6.5 - 8.1 g/dL 7.6  7.5  7.7   Total Bilirubin 0.0 - 1.2 mg/dL 1.6  1.1  1.0   Alkaline Phos 38 - 126 U/L 102   91   AST 15 - 41 U/L 20  22  25    ALT 0 - 44 U/L 19  16  25     MRI brain/orbits/cervical spine w/wo contrast:   MRI HEAD FINDINGS   Brain: Mild for age scattered T2/FLAIR hyperintense in the white matter. No pathologic enhancement. No evidence of acute infarct, acute hemorrhage, mass lesion or midline shift.   Vascular: Major arterial flow voids are maintained at the skull base.   Skull and upper cervical spine: Normal marrow signal.   Other: No mastoid effusions.   MRI ORBITS FINDINGS   Orbits: No traumatic or inflammatory finding. Globes, optic nerves, orbital fat, extraocular muscles, vascular structures, and lacrimal glands are normal.   Visualized sinuses: Clear.   Soft tissues: Negative.   MRI CERVICAL SPINE FINDINGS   Alignment: No substantial sagittal subluxation.   Vertebral bodies: Vertebral body heights are maintained. No evidence of acute fracture or discitis/osteomyelitis. No suspicious bone lesion.   Cord: No convincing cord signal abnormality. No abnormal enhancement.   Disc levels: Mild for age facet and uncovertebral hypertrophy at multiple levels without significant canal or foraminal stenosis.   IMPRESSION: MRI head:   Mild for age scattered T2/FLAIR hyperintense in the white matter, which are nonspecific but could  relate to chronic microvascular ischemic disease or demyelination. No enhancement to suggest active demyelination.   MRI cervical spine:   1. No convincing cord signal abnormality.  No cord enhancement. 2. Mild for age degenerative change without significant stenosis.      Review of Systems: Patient complains of symptoms per HPI as well as the following symptoms none. Pertinent negatives and positives per HPI. All others negative.   Social History   Socioeconomic History   Marital status: Married    Spouse name: Not on file   Number of children: Not on file   Years of education: Not on file   Highest education level: Not on file  Occupational History   Not on file  Tobacco Use   Smoking status: Never    Passive exposure: Never   Smokeless tobacco: Never  Vaping Use   Vaping status: Never Used  Substance and Sexual Activity   Alcohol use: No   Drug use: No   Sexual activity: Not on file  Other Topics Concern   Not on file  Social History Narrative   Not on file   Social Drivers of Health   Financial Resource Strain: Not on file  Food Insecurity: Not on file  Transportation Needs: No Transportation Needs (04/05/2018)   PRAPARE - Administrator, Civil Service (Medical): No    Lack of Transportation (Non-Medical): No  Physical Activity: Not on file  Stress: Not on file  Social Connections: Unknown (11/01/2021)   Received from [facility]   Social Network    Social Network: Not on file  Intimate Partner Violence: Unknown (09/24/2021)   Received [facility], [facility]   HITS    Physically Hurt: Not on file    Insult or Talk Down To: Not on file    Threaten Physical Harm: Not on file    Scream or Curse: Not on file    Family History  Problem Relation Age of Onset   Breast cancer Sister    Prostate cancer Father    Liver cancer Brother    Kidney cancer Brother    Stomach cancer Sister     Past Medical History:   Diagnosis Date   Family history of breast cancer    Family history of colon cancer    Family history of kidney cancer    Family history of prostate cancer    History of radiation therapy 04/13/18- 05/10/18   Left Breast 40.05 Gy in 15 fractions, Left breast boost 10 gy in 5 fractions   Malignant neoplasm of upper-outer quadrant of left breast in female, estrogen receptor positive (HCC) 02/12/2018   Personal history of radiation therapy     Patient Active Problem List   Diagnosis Date Noted   Diverticular disease of colon 07/24/2023   Glaucoma 07/24/2023   Hemorrhage of rectum and anus 07/24/2023   Hepatic cyst 07/24/2023   Syncope and collapse 07/24/2023   Family history of malignant neoplasm of digestive organs 07/24/2023   Chest wall mass 02/26/2018   Family history of breast cancer    Family history of colon cancer    Family history of prostate cancer    Family history of kidney cancer    Malignant neoplasm of upper-outer quadrant of left breast in female, estrogen receptor positive (HCC) 02/12/2018    Past Surgical History:  Procedure Laterality Date   BREAST EXCISIONAL BIOPSY Left    BREAST LUMPECTOMY Left 02/2018   BREAST LUMPECTOMY WITH RADIOACTIVE SEED AND SENTINEL LYMPH NODE BIOPSY Left 03/09/2018   Procedure: LEFT BREAST LUMPECTOMY WITH RADIOACTIVE SEED AND SENTINEL LYMPH NODE BIOPSY;  Surgeon: Ethyl Lenis, MD;  Location: [facility];  Service: General;  Laterality: Left;   CESAREAN SECTION      Current Outpatient Medications  Medication Sig Dispense Refill   acetaminophen  (TYLENOL ) 325 MG tablet Take 650 mg by mouth every 6 (six) hours as needed.     atorvastatin  (LIPITOR) 20 MG tablet Take 1 tablet (20 mg total) by mouth daily.     atorvastatin  (LIPITOR) 40 MG tablet Take 20 mg by mouth at bedtime.     calcium  gluconate 500 MG tablet Take 1 tablet by mouth 3 (three) times  daily.     calcium -vitamin D  250-100 MG-UNIT tablet Take 1 tablet by mouth 2  (two) times daily.     COVID-19 mRNA Vac-TriS, Pfizer, (PFIZER-BIONT COVID-19 VAC-TRIS) SUSP injection Inject into the muscle. 0.3 mL 0   latanoprost  (XALATAN ) 0.005 % ophthalmic solution Place 1 drop into both eyes at bedtime. 2.5 mL 12   letrozole  (FEMARA ) 2.5 MG tablet Take 1 tablet (2.5 mg total) by mouth daily. 90 tablet 3   Multiple Vitamin (MULTIVITAMIN WITH MINERALS) TABS Take 1 tablet by mouth daily.     Multiple Vitamin (MULTIVITAMIN) capsule Take 1 capsule by mouth daily.     Na Sulfate-K Sulfate-Mg Sulfate concentrate (SUPREP BOWEL PREP KIT) 17.5-3.13-1.6 GM/177ML SOLN See admin instructions.     predniSONE  (DELTASONE ) 20 MG tablet Take 3 tablets (60 mg total) by mouth daily. 6 tablet 0   prochlorperazine  (COMPAZINE ) 10 MG tablet Take 1 tablet (10 mg total) by mouth every 6 (six) hours as needed for up to 6 doses for nausea or vomiting. 6 tablet 0   No current facility-administered medications for this visit.    Allergies as of 07/30/2023 - Review Complete 07/30/2023  Allergen Reaction Noted   Combigan [brimonidine tartrate-timolol ] Other (See Comments) 04/05/2018    Vitals: BP (!) 152/84 (BP Location: Right Arm, Patient Position: Sitting, Cuff Size: Normal) Comment: manual recheck; pt will check it once she gets back home  Pulse 79   Ht 5' 9 (1.753 m)   Wt 150 lb 6.4 oz (68.2 kg)   BMI 22.21 kg/m  Last Weight:  Wt Readings from Last 1 Encounters:  07/30/23 150 lb 6.4 oz (68.2 kg)   Last Height:   Ht Readings from Last 1 Encounters:  07/30/23 5' 9 (1.753 m)     Physical exam: Exam: Gen: NAD, conversant, well nourised, well groomed                     CV: RRR, no MRG. No Carotid Bruits. No peripheral edema, warm, nontender Eyes: Conjunctivae clear without exudates or hemorrhage  Neuro: Detailed Neurologic Exam  Speech:    Speech is normal; fluent and spontaneous with normal comprehension.  Cognition:    The patient is oriented to person, place, and time;      recent and remote memory intact;     language fluent;     normal attention, concentration,     fund of knowledge Cranial Nerves:    The pupils are equal, round, and reactive to light. Attempted, pupils too small to visualize fundi. Visual fields are full to finger confrontation. Extraocular movements are intact. Trigeminal sensation is intact and the muscles of mastication are normal. The face is symmetric. The palate elevates in the midline. Hearing intact. Voice is normal. Shoulder shrug is normal. The tongue has normal motion without fasciculations.   Coordination: nml  Gait: nml  Motor Observation:    No asymmetry, no atrophy, and no involuntary movements noted. Tone:    Normal muscle tone.    Posture:    Posture is normal. normal erect    Strength:    Strength is V/V in the upper and lower limbs.      Sensation: intact to LT     Reflex Exam:  DTR's:    Deep tendon reflexes in the upper and lower extremities are normal bilaterally.   Toes:    The toes are downgoing bilaterally.   Clonus:    Clonus is absent.    Assessment/Plan:  Patient with new onset headaches, No hx of migraines. Unusual presentation with differential as below. MRI showed chronic microvascular changes vs demyelinating disease however it would be very unual for Newman patient to present with MS at this age with Newman normal neurologic exam will follow  Migraines would be unlikely because she has never had headaches, cannot rule it out Temporal arteritis less likely but will check esr/crp Other possibility is Occipital neuralgia as she states the pain radiates from the occipital area to the eyes, reviewed images and she thought this was likely will monitor  Blood pressure elevated? Elevated BP can cause headaches or be as Newman result of headaches, she states at home her blood pressure Is normal 60mg  prednisone  for 2 days as we wait for esr/crp, will check in and see how she feels may consider nerve  blocks  Orders Placed This Encounter  Procedures   Sedimentation rate   C-reactive protein   Meds ordered this encounter  Medications   predniSONE  (DELTASONE ) 20 MG tablet    Sig: Take 3 tablets (60 mg total) by mouth daily.    Dispense:  6 tablet    Refill:  0    Cc: Kerry Newman DELENA DEVONNA Kerry Aliene, MD  Onetha Epp, MD  [facility] [address] [place], [place] [address]  Phone [phone] Fax [phone]

## 2023-07-30 NOTE — Patient Instructions (Addendum)
 Migraines would be unlikely because you have never had headaches Does not appear to be multiple sclerosis presentation Temporal arteritis? Other possibility is Occipital neuralgia? Blood pressure elevated? Elevated BP can cause headaches or be as a result of headaches 60mg  prednisone  for 2 days   Temporal Arteritis  Temporal arteritis is a condition that makes your arteries become inflamed. It can happen in any part of the body. Most often, it affects your head and face. It is also called giant cell arteritis. This condition can cause severe problems such as blindness. Early treatment can help prevent those problems. What are the causes? The cause of this condition is not known. What increases the risk? You may be more likely to get this condition if: You are older than 73 years of age. You are female. You are Caucasian. You are of Danish, Swedish, Finnish, Norwegian, or Icelandic ancestry. You have a family history of the condition. You have polymyalgia rheumatica (PMR). This is a condition that causes muscle pain and stiffness. What are the signs or symptoms? Some people with this condition have just one symptom. Others have more than one symptom. Most symptoms are related to the head and face. These may include: Headache. Hard, swollen, or tender temples. Your temples are the areas on either side of your forehead. Pain when you comb your hair or when you lay your head down. Pain in your jaw when you chew. Pain in your throat or tongue. Problems with your vision. These may include sudden loss of vision in one eye or seeing double. Other symptoms may include: Fever. Feeling tired (fatigue). A dry cough. Pain in your hips and shoulders. Pain in your arms when you exercise. Depression. Weight loss. How is this diagnosed? This condition may be diagnosed based on your symptoms, your medical history, and a physical exam. You may also have tests done. These may include: Blood  tests. Biopsy. This is when a small piece of tissue is removed for testing. Imaging tests, such as an ultrasound or MRI. How is this treated? This condition may be treated with medicines to: Reduce inflammation (steroids). Weaken your immune system (immunosuppressants). Your immune system is your body's defense system. Treat vision problems. Follow these instructions at home: Medicines Take over-the-counter and prescription medicines only as told by your health care provider. Take any vitamins or supplements that your provider recommends. These may include vitamin D  and calcium  to help stop your bones from getting weak. Eating and drinking  Eat foods that are good for your heart. These may include: Foods high in fiber, such as fresh fruits and vegetables, whole grains, and beans. Foods that have healthy fats in them (omega-3 fats), such as fish, flaxseed, and flaxseed oil. Limit foods that are high in saturated fat and cholesterol. These include processed and fried foods, fatty meat, and full-fat dairy. Limit how much salt (sodium) you eat. Include calcium  and vitamin D  in your diet. Good sources of these include: Low-fat dairy products, such as milk, yogurt, and cheese. Certain fish, such as fresh or canned salmon, tuna, and sardines. Fortified products. This means that they have calcium  and vitamin D  added to them. These include fortified cereals and juice. General instructions Exercise. Talk with your provider about what exercises are safe for you. You may be told to do exercises that make your heart beat faster (aerobic exercise). These can help control your blood pressure and prevent bone loss. Stay up-to-date on all of your vaccines as told by your provider. Keep  all follow-up visits. The medicines used to treat this condition can make you more likely to have problems such as bone loss or diabetes. Your provider will check for problems during your follow-up visits. Contact a health  care provider if: Your symptoms get worse. You have any signs of infection. These may include fever, swelling, redness, warmth, or tenderness. Get help right away if: You lose your vision. Your pain does not go away, even after you take medicine. You have chest pain. You have trouble breathing. One side of your face or body gets weak or numb all of a sudden. These symptoms may be an emergency. Get help right away. Call 911. Do not wait to see if the symptoms will go away. Do not drive yourself to the hospital. This information is not intended to replace advice given to you by your health care provider. Make sure you discuss any questions you have with your health care provider. Document Revised: 03/24/2022 Document Reviewed: 03/24/2022 Elsevier Patient Education  2024 Else[employer] Inc.   Occipital neuralgia?    Occipital Neuralgia  Occipital neuralgia is a type of headache that causes brief episodes of very bad pain in the back of the head. Pain from occipital neuralgia may spread (radiate) to other parts of the head. These headaches may be caused by irritation of the nerves that leave the spinal cord high up in the neck, just below the base of the skull (occipital nerves). The occipital nerves transmit sensations from the back of the head, the top of the head, and the areas behind the ears. What are the causes? This condition can occur without any known cause (primary headache syndrome). In other cases, this condition is caused by pressure on or irritation of one of the two occipital nerves. Pressure and irritation may be due to: Muscle spasm in the neck. Neck injury. Wear and tear of the vertebrae in the neck (osteoarthritis). Disease of the disks that separate the vertebrae. Swollen blood vessels that put pressure on the occipital nerves. Infections. Tumors. Diabetes. What are the signs or symptoms? This condition causes brief burning, stabbing, electric, shocking, or shooting pain  in the back of the head that can radiate to the top of the head. It can happen on one side or both sides of the head. It can also cause: Pain behind the eye. Pain triggered by neck movement or hair brushing. Scalp tenderness. Aching in the back of the head between episodes of very bad pain. Pain that gets worse with exposure to bright lights. How is this diagnosed? Your health care provider may diagnose the condition based on a physical exam and your symptoms. Tests may be done, such as: Imaging studies of the brain and neck (cervical spine), such as an MRI or CT scan. These look for causes of pinched nerves. Applying pressure to the nerves in the neck to try to re-create the pain. Injection of numbing medicine into the occipital nerve areas to see if pain goes away (diagnostic nerve block). How is this treated? Treatment for this condition may begin with simple measures, such as: Rest. Massage. Applying heat or cold to the area. Over-the-counter pain relievers. If these measures do not work, you may need other treatments, including: Medicines, such as: Prescription-strength anti-inflammatory medicines. Muscle relaxants. Anti-seizure medicines, which can relieve pain. Antidepressants, which can relieve pain. Injected medicines, such as medicines that numb the area (local anesthetic) and steroids. Pulsed radiofrequency ablation. This is when wires are implanted to deliver electrical impulses that  block pain signals from the occipital nerve. Surgery to relieve nerve pressure. Physical therapy. Follow these instructions at home: Managing pain     Avoid any activities that cause pain. Rest when you have an attack of pain. Try gentle massage to relieve pain. Try a different pillow or sleeping position. If directed, apply heat to the affected area as often as told by your health care provider. Use the heat source that your health care provider recommends, such as a moist heat pack or a  heating pad. Place a towel between your skin and the heat source. Leave the heat on for 20-30 minutes. Remove the heat if your skin turns bright red. This is especially important if you are unable to feel pain, heat, or cold. You have a greater risk of getting burned. If directed, put ice on the back of your head and neck area. To do this: Put ice in a plastic bag. Place a towel between your skin and the bag. Leave the ice on for 20 minutes, 2-3 times a day. Remove the ice if your skin turns bright red. This is very important. If you cannot feel pain, heat, or cold, you have a greater risk of damage to the area. General instructions Take over-the-counter and prescription medicines only as told by your health care provider. Avoid things that make your symptoms worse, such as bright lights. Try to stay active. Get regular exercise that does not cause pain. Ask your health care provider to suggest safe exercises for you. Work with a physical therapist to learn stretching exercises you can do at home. Practice good posture. Keep all follow-up visits. This is important. Contact a health care provider if: Your medicine is not working. You have new or worsening symptoms. Get help right away if: You have very bad head pain that does not go away. You have a sudden change in vision, balance, or speech. These symptoms may represent a serious problem that is an emergency. Do not wait to see if the symptoms will go away. Get medical help right away. Call your local emergency services (911 in the [place] Do not drive yourself to the hospital. Summary Occipital neuralgia is a type of headache that causes brief episodes of very bad pain in the back of the head. Pain from occipital neuralgia may spread (radiate) to other parts of the head. Treatment for this condition includes rest, massage, and medicines. This information is not intended to replace advice given to you by your health care provider. Make  sure you discuss any questions you have with your health care provider. Document Revised: 04/07/2020 Document Reviewed: 04/08/2020 Elsevier Patient Education  2024 Elsevier Inc.  Prednisone  Tablets What is this medication? PREDNISONE  (PRED ni sone) treats many conditions such as asthma, allergic reactions, arthritis, inflammatory bowel diseases, adrenal, and blood or bone marrow disorders. It works by decreasing inflammation, slowing down an overactive immune system, or replacing cortisol normally made in the body. Cortisol is a hormone that plays an important role in how the body responds to stress, illness, and injury. It belongs to a group of medications called steroids. This medicine may be used for other purposes; ask your health care provider or pharmacist if you have questions. COMMON BRAND NAME(S): Deltasone , Predone, Sterapred, Sterapred DS What should I tell my care team before I take this medication? They need to know if you have any of these conditions: Cushing's syndrome Diabetes Glaucoma Heart disease High blood pressure Infection (especially a virus infection such as  chickenpox, cold sores, or herpes) Kidney disease Liver disease Mental illness Myasthenia gravis Osteoporosis Seizures Stomach or intestine problems Thyroid disease An unusual or allergic reaction to lactose, prednisone , other medications, foods, dyes, or preservatives Pregnant or trying to get pregnant Breast-feeding How should I use this medication? Take this medication by mouth with a glass of water. Follow the directions on the prescription label. Take this medication with food. If you are taking this medication once a day, take it in the morning. Do not take more medication than you are told to take. Do not suddenly stop taking your medication because you may develop a severe reaction. Your care team will tell you how much medication to take. If your care team wants you to stop the medication, the dose  may be slowly lowered over time to avoid any side effects. Talk to your care team about the use of this medication in children. Special care may be needed. Overdosage: If you think you have taken too much of this medicine contact a poison control center or emergency room at once. NOTE: This medicine is only for you. Do not share this medicine with others. What if I miss a dose? If you miss a dose, take it as soon as you can. If it is almost time for your next dose, talk to your care team. You may need to miss a dose or take an extra dose. Do not take double or extra doses without advice. What may interact with this medication? Do not take this medication with any of the following: Metyrapone Mifepristone This medication may also interact with the following: Aminoglutethimide Amphotericin B Aspirin and aspirin-like medications Barbiturates Certain medications for diabetes, like glipizide or glyburide Cholestyramine Cholinesterase inhibitors Cyclosporine Digoxin Diuretics Ephedrine  Female hormones, like estrogens and birth control pills Isoniazid Ketoconazole NSAIDS, medications for pain and inflammation, like ibuprofen or naproxen Phenytoin Rifampin Toxoids Vaccines Warfarin This list may not describe all possible interactions. Give your health care provider a list of all the medicines, herbs, non-prescription drugs, or dietary supplements you use. Also tell them if you smoke, drink alcohol, or use illegal drugs. Some items may interact with your medicine. What should I watch for while using this medication? Visit your care team for regular checks on your progress. If you are taking this medication over a prolonged period, carry an identification card with your name and address, the type and dose of your medication, and your care team's name and address. This medication may increase your risk of getting an infection. Tell your care team if you are around anyone with measles or  chickenpox, or if you develop sores or blisters that do not heal properly. If you are going to have surgery, tell your care team that you have taken this medication within the last twelve months. Ask your care team about your diet. You may need to lower the amount of salt you eat. This medication may increase blood sugar. Ask your care team if changes in diet or medications are needed if you have diabetes. What side effects may I notice from receiving this medication? Side effects that you should report to your care team as soon as possible: Allergic reactions--skin rash, itching, hives, swelling of the face, lips, tongue, or throat Cushing syndrome--increased fat around the midsection, upper back, neck, or face, pink or purple stretch marks on the skin, thinning, fragile skin that easily bruises, unexpected hair growth High blood sugar (hyperglycemia)--increased thirst or amount of urine, unusual weakness or fatigue, blurry  vision Increase in blood pressure Infection--fever, chills, cough, sore throat, wounds that don't heal, pain or trouble when passing urine, general feeling of discomfort or being unwell Low adrenal gland function--nausea, vomiting, loss of appetite, unusual weakness or fatigue, dizziness Mood and behavior changes--anxiety, nervousness, confusion, hallucinations, irritability, hostility, thoughts of suicide or self-harm, worsening mood, feelings of depression Stomach bleeding--bloody or black, tar-like stools, vomiting blood or brown material that looks like coffee grounds Swelling of the ankles, hands, or feet Side effects that usually do not require medical attention (report to your care team if they continue or are bothersome): Acne General discomfort and fatigue Headache Increase in appetite Nausea Trouble sleeping Weight gain This list may not describe all possible side effects. Call your doctor for medical advice about side effects. You may report side effects to FDA  at 1-800-FDA-1088. Where should I keep my medication? Keep out of the reach of children. Store at room temperature between 15 and 30 degrees C (59 and 86 degrees F). Protect from light. Keep container tightly closed. Throw away any unused medication after the expiration date. NOTE: This sheet is a summary. It may not cover all possible information. If you have questions about this medicine, talk to your doctor, pharmacist, or health care provider.  2024 Elsevier/Gold Standard (2020-09-07 00:00:00)

## 2023-07-31 ENCOUNTER — Encounter: Payer: Self-pay | Admitting: Neurology

## 2023-07-31 LAB — C-REACTIVE PROTEIN: CRP: <1 mg/L (ref 0–10)

## 2023-07-31 LAB — SEDIMENTATION RATE: Sed Rate: 8 mm/h (ref 0–40)

## 2023-08-02 ENCOUNTER — Encounter: Payer: Self-pay | Admitting: Neurology

## 2023-08-24 ENCOUNTER — Other Ambulatory Visit: Payer: Self-pay | Admitting: Adult Health

## 2023-08-24 ENCOUNTER — Ambulatory Visit
Admission: RE | Admit: 2023-08-24 | Discharge: 2023-08-24 | Disposition: A | Discharge status: Home / Self Care | Payer: No Typology Code available for payment source | Source: Ambulatory Visit | Attending: Adult Health | Admitting: Adult Health

## 2023-08-24 DIAGNOSIS — R921 Mammographic calcification found on diagnostic imaging of breast: Secondary | ICD-10-CM

## 2023-09-01 ENCOUNTER — Inpatient Hospital Stay: Admission: RE | Admit: 2023-09-01 | Payer: No Typology Code available for payment source | Source: Ambulatory Visit

## 2024-02-25 ENCOUNTER — Ambulatory Visit
Admission: RE | Admit: 2024-02-25 | Discharge: 2024-02-25 | Disposition: A | Discharge status: Home / Self Care | Source: Ambulatory Visit | Attending: Adult Health | Admitting: Adult Health

## 2024-02-25 DIAGNOSIS — R921 Mammographic calcification found on diagnostic imaging of breast: Secondary | ICD-10-CM

## 2024-02-26 ENCOUNTER — Other Ambulatory Visit: Payer: Self-pay | Admitting: Adult Health

## 2024-02-26 DIAGNOSIS — R921 Mammographic calcification found on diagnostic imaging of breast: Secondary | ICD-10-CM

## 2024-02-29 ENCOUNTER — Inpatient Hospital Stay
Payer: No Typology Code available for payment source | Attending: Hematology and Oncology | Admitting: Hematology and Oncology

## 2024-02-29 VITALS — BP 144/67 | HR 63 | Temp 97.6°F | Resp 16 | Wt 145.4 lb

## 2024-02-29 DIAGNOSIS — Z1721 Progesterone receptor positive status: Secondary | ICD-10-CM | POA: Diagnosis not present

## 2024-02-29 DIAGNOSIS — Z170 Estrogen receptor positive status [ER+]: Secondary | ICD-10-CM | POA: Insufficient documentation

## 2024-02-29 DIAGNOSIS — Z17411 Hormone receptor positive with human epidermal growth factor receptor 2 negative status: Secondary | ICD-10-CM | POA: Insufficient documentation

## 2024-02-29 DIAGNOSIS — H9313 Tinnitus, bilateral: Secondary | ICD-10-CM | POA: Diagnosis not present

## 2024-02-29 DIAGNOSIS — Z79899 Other long term (current) drug therapy: Secondary | ICD-10-CM | POA: Insufficient documentation

## 2024-02-29 DIAGNOSIS — C50412 Malignant neoplasm of upper-outer quadrant of left female breast: Secondary | ICD-10-CM | POA: Diagnosis present

## 2024-02-29 DIAGNOSIS — Z79811 Long term (current) use of aromatase inhibitors: Secondary | ICD-10-CM | POA: Diagnosis not present

## 2024-02-29 DIAGNOSIS — Z923 Personal history of irradiation: Secondary | ICD-10-CM | POA: Diagnosis not present

## 2024-02-29 DIAGNOSIS — Z780 Asymptomatic menopausal state: Secondary | ICD-10-CM | POA: Diagnosis not present

## 2024-02-29 MED ORDER — LETROZOLE 2.5 MG PO TABS: 2.5000 mg | ORAL_TABLET | Freq: Every day | ORAL | 3 refills | Status: AC

## 2024-02-29 NOTE — Progress Notes (Signed)
 Patient Care Team: Shelda Atlas, MD as PCP - General (Internal Medicine) Ethyl Lenis, MD as Consulting Physician (General Surgery) Odean Potts, MD as Consulting Physician (Hematology and Oncology) Izell Domino, MD as Attending Physician (Radiation Oncology)  DIAGNOSIS:  Encounter Diagnoses  Name Primary?   Malignant neoplasm of upper-outer quadrant of left breast in female, estrogen receptor positive (HCC) Yes   Post-menopausal     SUMMARY OF ONCOLOGIC HISTORY: Oncology History  Malignant neoplasm of upper-outer quadrant of left breast in female, estrogen receptor positive (HCC)  02/09/2018 Initial Diagnosis   Screening detected left breast mass UOQ at 12:30 position 9 mm size, axilla negative, ultrasound biopsy revealed grade 2 IDC ER 100%, PR 100%, Ki-67 2%, HER-2 negative IHC 1+, T1b N0 stage Ia AJCC 8   02/18/2018 Genetic Testing   Genetic testing was negative and didn't detect any deleterious mutations.  Variant of Uncertain Significance: MUTYH c.1508G>A (p.Gly503Gl), SDHA c.1919A>G (p.Glu640Gly),SMAD4 c.521C>A (p.Thr174Asn), TERT c.403G>A (p.Gly135Arg). Genes tested include: AIP, ALK, APC, ATM,  AXIN2, BAP1, BARD1, BLM, BMPR1A, BRCA1, BRCA2, BRIP1, CASR, CDC73, CDH1, CDK4, CDKN1B, CDKN1C, CDKN2A (p14ARF), CDKN2A (p16INK4a), CEBPA, CHEK2, CTNNA1, DICER1, DIS3L2, EGFR, EPCAM*, FH , FLCN, GATA2, GPC3, GREM1*, HOXB13, HRAS, KIT, MAX, MEN1, MET, MITF, MLH1, MSH2, MSH3, MSH6, MUTYH, NBN, NF1, NF2, NTHL1, PALB2, PDGFRA, PHOX2B*, PMS2, POLD1, POLE, POT1, PRKAR1A, PTCH1, PTEN, RAD50, RAD51C, RAD51D, RB1, RECQL4, RET, RUNX1, SDHA*, SDHAF2, SDHB, SDHC, SDHD, SMAD4, SMARCA4, SMARCB1,SMARCE1, STK11, SUFU, TERC, TERT, TMEM127,  TP53, TSC1, TSC2, VHL, WRN*, WT1.   UPDATE: SDHA c.1919A>G (p.Glu640Gly) and SMAD4 c.521C>A (p.Thr174Asn) VUS were amended to likely benign. Amended report date is 07/24/2020.   UPDATE: TERT c.403G>A (p.Gly135Arg) VUS was amended to likely bengin. Amended report date  is 04/17/2023.    03/09/2018 Surgery   Left lumpectomy: IDC grade 1, 0.9 cm, intermediate grade DCIS, no lymphovascular or perineural invasion, 0/1 lymph node negative,  ER 100%, PR 100%, Ki-67 2%, HER-2 negative IHC 1+,T1bN0 stage Ia   03/09/2018 Oncotype testing   7/3%   03/17/2018 Cancer Staging   Staging form: Breast, AJCC 8th Edition - Pathologic: Stage IA (pT1b, pN0, cM0, G1, ER+, PR+, HER2-) - Signed by Odean Potts, MD on 03/17/2018   04/13/2018 - 05/10/2018 Radiation Therapy   Radiation with Izell 1. Left Breast / 40.05 Gy in 15 fractions 2. Left Breast Boost / 10 Gy in 5 fractions   05/2018 -  Anti-estrogen oral therapy   Letrozole  daily     CHIEF COMPLIANT: Surveillance of breast cancer  HISTORY OF PRESENT ILLNESS:  History of Present Illness Kerry Newman is a 73 year old female who presents for follow-up and surveillance of breast cancer.  She complains of of sensorineural hearing loss and bilateral tinnitus that was detected recently  She experiences more prominent tinnitus at night, particularly when trying to sleep, but it does not affect her during the day. She has been on letrozole  for six years with one year remaining. A recent mammogram showed calcium  deposits that have been present for almost a year. Her bone density is being monitored, with the last test conducted in 2024.     ALLERGIES:  is allergic to combigan [brimonidine tartrate-timolol ].  MEDICATIONS:  Current Outpatient Medications  Medication Sig Dispense Refill   acetaminophen  (TYLENOL ) 325 MG tablet Take 650 mg by mouth every 6 (six) hours as needed.     atorvastatin  (LIPITOR) 20 MG tablet Take 1 tablet (20 mg total) by mouth daily.     atorvastatin  (LIPITOR) 40 MG  tablet Take 20 mg by mouth at bedtime.     calcium  gluconate 500 MG tablet Take 1 tablet by mouth 3 (three) times daily.     calcium -vitamin D  250-100 MG-UNIT tablet Take 1 tablet by mouth 2 (two) times daily.     COVID-19 mRNA  Vac-TriS, Pfizer, (PFIZER-BIONT COVID-19 VAC-TRIS) SUSP injection Inject into the muscle. 0.3 mL 0   latanoprost  (XALATAN ) 0.005 % ophthalmic solution Place 1 drop into both eyes at bedtime. 2.5 mL 12   Multiple Vitamin (MULTIVITAMIN WITH MINERALS) TABS Take 1 tablet by mouth daily.     Multiple Vitamin (MULTIVITAMIN) capsule Take 1 capsule by mouth daily.     Na Sulfate-K Sulfate-Mg Sulfate concentrate (SUPREP BOWEL PREP KIT) 17.5-3.13-1.6 GM/177ML SOLN See admin instructions.     ROCKLATAN 0.02-0.005 % SOLN SMARTSIG:1 Drop(s) In Eye(s) Every Evening     letrozole  (FEMARA ) 2.5 MG tablet Take 1 tablet (2.5 mg total) by mouth daily. 90 tablet 3   prochlorperazine  (COMPAZINE ) 10 MG tablet Take 1 tablet (10 mg total) by mouth every 6 (six) hours as needed for up to 6 doses for nausea or vomiting. 6 tablet 0   No current facility-administered medications for this visit.    PHYSICAL EXAMINATION: ECOG PERFORMANCE STATUS: 1 - Symptomatic but completely ambulatory  Vitals:   02/29/24 1016  BP: (!) 144/67  Pulse: 63  Resp: 16  Temp: 97.6 F (36.4 C)  SpO2: 100%   Filed Weights   02/29/24 1016  Weight: 145 lb 6.4 oz (66 kg)     LABORATORY DATA:  I have reviewed the data as listed    Latest Ref Rng & Units 07/25/2023    2:36 PM 01/27/2023   12:00 AM 01/18/2020    3:55 PM  CMP  Glucose 70 - 99 mg/dL 89  82  837   BUN 8 - 23 mg/dL 13  14  14    Creatinine 0.44 - 1.00 mg/dL 9.10  9.25  9.14   Sodium 135 - 145 mmol/L 138  143  138   Potassium 3.5 - 5.1 mmol/L 4.0  3.5  3.7   Chloride 98 - 111 mmol/L 107  108  101   CO2 22 - 32 mmol/L 25  24  25    Calcium  8.9 - 10.3 mg/dL 9.0  8.9  9.0   Total Protein 6.5 - 8.1 g/dL 7.6  7.5  7.7   Total Bilirubin 0.0 - 1.2 mg/dL 1.6  1.1  1.0   Alkaline Phos 38 - 126 U/L 102   91   AST 15 - 41 U/L 20  22  25    ALT 0 - 44 U/L 19  16  25      Lab Results  Component Value Date   WBC 4.0 07/25/2023   HGB 12.1 07/25/2023   HCT 37.2 07/25/2023   MCV  96.6 07/25/2023   PLT 203 07/25/2023   NEUTROABS 2.4 07/25/2023    ASSESSMENT & PLAN:  Malignant neoplasm of upper-outer quadrant of left breast in female, estrogen receptor positive (HCC) 03/09/2018: Left lumpectomy: IDC grade 1, 0.9 cm, intermediate grade DCIS, no lymphovascular or perineural invasion, 0/1 lymph node negative,  ER 100%, PR 100%, Ki-67 2%, HER-2 negative IHC 1+,T1bN0 stage Ia Oncotype DX recurrence score 7: 3% RRR at 9 years Adjuvant radiation therapy completed 05/05/2018   Treatment plan: Adjuvant antiestrogen therapy with letrozole  2.5 mg daily starting 05/23/2018 (7 years treatment plan)   Letrozole  toxicities: Denies any hot flashes or myalgias.  Breast cancer surveillance:  Mammogram 02/25/2024 benign breast density category C Stereotactic biopsy 02/03/2019: Benign fat necrosis  Bone density test: 03/10/2023: T-score -2.2: Osteopenia, recommend calcium  vitamin D  Return to clinic in 1 year for follow-up ------------------------------------- Assessment and Plan Assessment & Plan Estrogen receptor positive malignant neoplasm of upper-outer quadrant of left breast, status post treatment, on adjuvant letrozole  Stable on letrozole  for six years. Mammogram showed benign stable calcifications. Breast health stable. - Continue letrozole  for one more year. - Ensure pharmacy refills are up to date at [facility] on [address]. - Schedule follow-up appointment in one year for final evaluation.  Tinnitus and bilateral sensorineural hearing loss Bilateral tinnitus more noticeable at night, not affecting daily activities. Sensorineural hearing loss present but not impacting functionality.      Orders Placed This Encounter  Procedures   DG Bone Density    Standing Status:   Future    Expected Date:   02/22/2025    Expiration Date:   05/23/2025    Reason for Exam (SYMPTOM  OR DIAGNOSIS REQUIRED):   post menopausal    Preferred imaging location?:   [facility]     Release to patient:   Immediate   The patient has a good understanding of the overall plan. she agrees with it. she will call with any problems that may develop before the next visit here. Total time spent: 30 mins including face to face time and time spent for planning, charting and co-ordination of care   Naomi MARLA Chad, MD 02/29/24

## 2024-02-29 NOTE — Assessment & Plan Note (Signed)
 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   Breast cancer surveillance:  Mammogram 02/25/2024 benign breast density category C Stereotactic biopsy 02/03/2019: Benign fat necrosis Breast exam: 02/29/2024: Benign  Bone density test: 03/10/2023: T-score -2.2: Osteopenia, recommend calcium  vitamin D  and bisphosphonate therapy Return to clinic in 1 year for follow-up

## 2024-05-03 ENCOUNTER — Other Ambulatory Visit (HOSPITAL_BASED_OUTPATIENT_CLINIC_OR_DEPARTMENT_OTHER): Payer: Self-pay | Admitting: Hematology and Oncology

## 2024-05-03 DIAGNOSIS — Z780 Asymptomatic menopausal state: Secondary | ICD-10-CM

## 2024-05-03 DIAGNOSIS — C50412 Malignant neoplasm of upper-outer quadrant of left female breast: Secondary | ICD-10-CM

## 2024-07-15 NOTE — Telephone Encounter (Signed)
 Error

## 2024-08-26 ENCOUNTER — Encounter

## 2025-02-28 ENCOUNTER — Inpatient Hospital Stay: Admitting: Hematology and Oncology

## 2025-03-13 ENCOUNTER — Ambulatory Visit (HOSPITAL_BASED_OUTPATIENT_CLINIC_OR_DEPARTMENT_OTHER)
